# Patient Record
Sex: Female | Born: 1995 | Race: Black or African American | Hispanic: No | Marital: Single | Smoking: Never smoker
Health system: Southern US, Community
[De-identification: ages and names within clinical notes are randomized; demographics above are authoritative.]

## PROBLEM LIST (undated history)

## (undated) ENCOUNTER — Inpatient Hospital Stay (HOSPITAL_COMMUNITY): Payer: Self-pay

## (undated) DIAGNOSIS — R519 Headache, unspecified: Secondary | ICD-10-CM

## (undated) DIAGNOSIS — R51 Headache: Secondary | ICD-10-CM

## (undated) DIAGNOSIS — R011 Cardiac murmur, unspecified: Secondary | ICD-10-CM

## (undated) HISTORY — PX: NO PAST SURGERIES: SHX2092

---

## 2016-05-12 ENCOUNTER — Encounter (HOSPITAL_COMMUNITY): Payer: Self-pay

## 2016-05-12 ENCOUNTER — Emergency Department (HOSPITAL_COMMUNITY)
Admission: EM | Admit: 2016-05-12 | Discharge: 2016-05-13 | Disposition: A | Discharge status: Home / Self Care | Payer: Medicaid Other | Attending: Emergency Medicine | Admitting: Emergency Medicine

## 2016-05-12 DIAGNOSIS — N760 Acute vaginitis: Secondary | ICD-10-CM

## 2016-05-12 DIAGNOSIS — Z3A01 Less than 8 weeks gestation of pregnancy: Secondary | ICD-10-CM | POA: Diagnosis not present

## 2016-05-12 DIAGNOSIS — O23591 Infection of other part of genital tract in pregnancy, first trimester: Secondary | ICD-10-CM | POA: Diagnosis not present

## 2016-05-12 DIAGNOSIS — R1030 Lower abdominal pain, unspecified: Secondary | ICD-10-CM

## 2016-05-12 DIAGNOSIS — B9689 Other specified bacterial agents as the cause of diseases classified elsewhere: Secondary | ICD-10-CM

## 2016-05-12 DIAGNOSIS — Z3490 Encounter for supervision of normal pregnancy, unspecified, unspecified trimester: Secondary | ICD-10-CM

## 2016-05-12 LAB — COMPREHENSIVE METABOLIC PANEL
ALBUMIN: 4.7 g/dL (ref 3.5–5.0)
ALK PHOS: 56 U/L (ref 38–126)
ALT: 15 U/L (ref 14–54)
ANION GAP: 7 (ref 5–15)
AST: 17 U/L (ref 15–41)
BUN: 16 mg/dL (ref 6–20)
CALCIUM: 10.2 mg/dL (ref 8.9–10.3)
CO2: 23 mmol/L (ref 22–32)
Chloride: 106 mmol/L (ref 101–111)
Creatinine, Ser: 0.61 mg/dL (ref 0.44–1.00)
GFR calc Af Amer: >60 mL/min (ref >60)
GFR calc non Af Amer: >60 mL/min (ref >60)
GLUCOSE: 100 mg/dL — AB (ref 65–99)
POTASSIUM: 3.8 mmol/L (ref 3.5–5.1)
SODIUM: 136 mmol/L (ref 135–145)
Total Bilirubin: 0.7 mg/dL (ref 0.3–1.2)
Total Protein: 8.1 g/dL (ref 6.5–8.1)

## 2016-05-12 LAB — CBC
HEMATOCRIT: 38.1 % (ref 36.0–46.0)
HEMOGLOBIN: 13.7 g/dL (ref 12.0–15.0)
MCH: 28.6 pg (ref 26.0–34.0)
MCHC: 36 g/dL (ref 30.0–36.0)
MCV: 79.5 fL (ref 78.0–100.0)
Platelets: 417 10*3/uL — ABNORMAL HIGH (ref 150–400)
RBC: 4.79 MIL/uL (ref 3.87–5.11)
RDW: 12.9 % (ref 11.5–15.5)
WBC: 14.8 10*3/uL — ABNORMAL HIGH (ref 4.0–10.5)

## 2016-05-12 LAB — POC URINE PREG, ED: Preg Test, Ur: POSITIVE — AB

## 2016-05-12 LAB — LIPASE, BLOOD: Lipase: 30 U/L (ref 11–51)

## 2016-05-12 NOTE — ED Notes (Signed)
Bed: [facility] Expected date: 05/12/16 Expected time:  Means of arrival:  Comments: 20 y.o. female

## 2016-05-12 NOTE — ED Notes (Signed)
Patient c/o increasing abdominal pain today that began x1 week ago.  Patient states that has had nausea x1 week "on and off".  Denies chest pain, denies SOB.  Patient states that last BM was yesterday and was normal.  Patient states pain in lower mid abdomen.  Patient states that can not remember when last menstrual period was.  Denies bleeding.  Denies difficulty with urination or blood in urine.

## 2016-05-13 ENCOUNTER — Emergency Department (HOSPITAL_COMMUNITY): Payer: Medicaid Other

## 2016-05-13 LAB — URINALYSIS, ROUTINE W REFLEX MICROSCOPIC
BILIRUBIN URINE: NEGATIVE
GLUCOSE, UA: NEGATIVE mg/dL
HGB URINE DIPSTICK: NEGATIVE
KETONES UR: NEGATIVE mg/dL
LEUKOCYTES UA: NEGATIVE
Nitrite: NEGATIVE
PH: 6 (ref 5.0–8.0)
PROTEIN: NEGATIVE mg/dL
Specific Gravity, Urine: 1.023 (ref 1.005–1.030)

## 2016-05-13 LAB — WET PREP, GENITAL
Sperm: NONE SEEN
TRICH WET PREP: NONE SEEN
YEAST WET PREP: NONE SEEN

## 2016-05-13 LAB — HCG, QUANTITATIVE, PREGNANCY: HCG, BETA CHAIN, QUANT, S: 34804 m[IU]/mL — AB (ref <5)

## 2016-05-13 MED ORDER — PRENATAL COMPLETE 14-0.4 MG PO TABS: 1.0000 | ORAL_TABLET | Freq: Every day | ORAL | Status: DC

## 2016-05-13 MED ORDER — METRONIDAZOLE 500 MG PO TABS: 500.0000 mg | ORAL_TABLET | Freq: Two times a day (BID) | ORAL | Status: DC

## 2016-05-13 NOTE — Discharge Instructions (Signed)
Bacterial Vaginosis Bacterial vaginosis is a vaginal infection that occurs when the normal balance of bacteria in the vagina is disrupted. It results from an overgrowth of certain bacteria. This is the most common vaginal infection in women of childbearing age. Treatment is important to prevent complications, especially in pregnant women, as it can cause a premature delivery. CAUSES  Bacterial vaginosis is caused by an increase in harmful bacteria that are normally present in smaller amounts in the vagina. Several different kinds of bacteria can cause bacterial vaginosis. However, the reason that the condition develops is not fully understood. RISK FACTORS Certain activities or behaviors can put you at an increased risk of developing bacterial vaginosis, including:  Having a new sex partner or multiple sex partners.  Douching.  Using an intrauterine device (IUD) for contraception. Women do not get bacterial vaginosis from toilet seats, bedding, swimming pools, or contact with objects around them. SIGNS AND SYMPTOMS  Some women with bacterial vaginosis have no signs or symptoms. Common symptoms include:  Grey vaginal discharge.  A fishlike odor with discharge, especially after sexual intercourse.  Itching or burning of the vagina and vulva.  Burning or pain with urination. DIAGNOSIS  Your health care provider will take a medical history and examine the vagina for signs of bacterial vaginosis. A sample of vaginal fluid may be taken. Your health care provider will look at this sample under a microscope to check for bacteria and abnormal cells. A vaginal pH test may also be done.  TREATMENT  Bacterial vaginosis may be treated with antibiotic medicines. These may be given in the form of a pill or a vaginal cream. A second round of antibiotics may be prescribed if the condition comes back after treatment. Because bacterial vaginosis increases your risk for sexually transmitted diseases, getting  treated can help reduce your risk for chlamydia, gonorrhea, HIV, and herpes. HOME CARE INSTRUCTIONS   Only take over-the-counter or prescription medicines as directed by your health care provider.  If antibiotic medicine was prescribed, take it as directed. Make sure you finish it even if you start to feel better.  Tell all sexual partners that you have a vaginal infection. They should see their health care provider and be treated if they have problems, such as a mild rash or itching.  During treatment, it is important that you follow these instructions:  Avoid sexual activity or use condoms correctly.  Do not douche.  Avoid alcohol as directed by your health care provider.  Avoid breastfeeding as directed by your health care provider. SEEK MEDICAL CARE IF:   Your symptoms are not improving after 3 days of treatment.  You have increased discharge or pain.  You have a fever. MAKE SURE YOU:   Understand these instructions.  Will watch your condition.  Will get help right away if you are not doing well or get worse. FOR MORE INFORMATION  Centers for Disease Control and Prevention, Division of STD Prevention: SolutionApps.co.zawww.cdc.gov/std American Sexual Health Association (ASHA): www.ashastd.org    This information is not intended to replace advice given to you by your health care provider. Make sure you discuss any questions you have with your health care provider.   Document Released: 10/31/2005 Document Revised: 11/21/2014 Document Reviewed: 06/12/2013 Elsevier Interactive Patient Education 2016 [employer] Eating Plan for Pregnant Women While you are pregnant, your body will require additional nutrition to help support your growing baby. It is recommended that you consume:  150 additional calories each day during your first trimester.  300 additional calories each day during your second trimester.  300 additional calories each day during your third trimester. Eating a healthy,  well-balanced diet is very important for your health and for your baby's health. You also have a higher need for some vitamins and minerals, such as folic acid, calcium, iron, and vitamin D. WHAT DO I NEED TO KNOW ABOUT EATING DURING PREGNANCY?  Do not try to lose weight or go on a diet during pregnancy.  Choose healthy, nutritious foods. Choose  of a sandwich with a glass of milk instead of a candy bar or a high-calorie sugar-sweetened beverage.  Limit your overall intake of foods that have "empty calories." These are foods that have little nutritional value, such as sweets, desserts, candies, sugar-sweetened beverages, and fried foods.  Eat a variety of foods, especially fruits and vegetables.  Take a prenatal vitamin to help meet the additional needs during pregnancy, specifically for folic acid, iron, calcium, and vitamin D.  Remember to stay active. Ask your health care provider for exercise recommendations that are specific to you.  Practice good food safety and cleanliness, such as washing your hands before you eat and after you prepare raw meat. This helps to prevent foodborne illnesses, such as listeriosis, that can be very dangerous for your baby. Ask your health care provider for more information about listeriosis. WHAT DOES 150 EXTRA CALORIES LOOK LIKE? Healthy options for an additional 150 calories each day could be any of the following:  Plain low-fat yogurt (6-8 oz) with  cup of berries.  1 apple with 2 teaspoons of peanut butter.  Cut-up vegetables with  cup of hummus.  Low-fat chocolate milk (8 oz or 1 cup).  1 string cheese with 1 medium orange.   of a peanut butter and jelly sandwich on whole-wheat bread (1 tsp of peanut butter). For 300 calories, you could eat two of those healthy options each day.  WHAT IS A HEALTHY AMOUNT OF WEIGHT TO GAIN? The recommended amount of weight for you to gain is based on your pre-pregnancy BMI. If your pre-pregnancy BMI  was:  Less than 18 (underweight), you should gain 28-40 lb.  18-24.9 (normal), you should gain 25-35 lb.  25-29.9 (overweight), you should gain 15-25 lb.  Greater than 30 (obese), you should gain 11-20 lb. WHAT IF I AM HAVING TWINS OR MULTIPLES? Generally, pregnant women who will be having twins or multiples may need to increase their daily calories by 300-600 calories each day. The recommended range for total weight gain is 25-54 lb, depending on your pre-pregnancy BMI. Talk with your health care provider for specific guidance about additional nutritional needs, weight gain, and exercise during your pregnancy. WHAT FOODS CAN I EAT? Grains Any grains. Try to choose whole grains, such as whole-wheat bread, oatmeal, or brown rice. Vegetables Any vegetables. Try to eat a variety of colors and types of vegetables to get a full range of vitamins and minerals. Remember to wash your vegetables well before eating. Fruits Any fruits. Try to eat a variety of colors and types of fruit to get a full range of vitamins and minerals. Remember to wash your fruits well before eating. Meats and Other Protein Sources Lean meats, including chicken, [place]key, fish, and lean cuts of beef, veal, or pork. Make sure that all meats are cooked to "well done." Tofu. Tempeh. Beans. Eggs. Peanut butter and other nut butters. Seafood, such as shrimp, crab, and lobster. If you choose fish, select types that are higher in  omega-3 fatty acids, including salmon, herring, mussels, trout, sardines, and pollock. Make sure that all meats are cooked to food-safe temperatures. Dairy Pasteurized milk and milk alternatives. Pasteurized yogurt and pasteurized cheese. Cottage cheese. Sour cream. Beverages Water. Juices that contain 100% fruit juice or vegetable juice. Caffeine-free teas and decaffeinated coffee. Drinks that contain caffeine are okay to drink, but it is better to avoid caffeine. Keep your total caffeine intake to less than  200 mg each day (12 oz of coffee, tea, or soda) or as directed by your health care provider. Condiments Any pasteurized condiments. Sweets and Desserts Any sweets and desserts. Fats and Oils Any fats and oils. The items listed above may not be a complete list of recommended foods or beverages. Contact your dietitian for more options. WHAT FOODS ARE NOT RECOMMENDED? Vegetables Unpasteurized (raw) vegetable juices. Fruits Unpasteurized (raw) fruit juices. Meats and Other Protein Sources Cured meats that have nitrates, such as bacon, salami, and hotdogs. Luncheon meats, bologna, or other deli meats (unless they are reheated until they are steaming hot). Refrigerated pate, meat spreads from a meat counter, smoked seafood that is found in the refrigerated section of a store. Raw fish, such as sushi or sashimi. High mercury content fish, such as tilefish, shark, swordfish, and king mackerel. Raw meats, such as tuna or beef tartare. Undercooked meats and poultry. Make sure that all meats are cooked to food-safe temperatures. Dairy Unpasteurized (raw) milk and any foods that have raw milk in them. Soft cheeses, such as feta, queso blanco, queso fresco, Brie, Camembert cheeses, blue-veined cheeses, and Panela cheese (unless it is made with pasteurized milk, which must be stated on the label). Beverages Alcohol. Sugar-sweetened beverages, such as sodas, teas, or energy drinks. Condiments Homemade fermented foods and drinks, such as pickles, sauerkraut, or kombucha drinks. (Store-bought pasteurized versions of these are okay.) Other Salads that are made in the store, such as ham salad, chicken salad, egg salad, tuna salad, and seafood salad. The items listed above may not be a complete list of foods and beverages to avoid. Contact your dietitian for more information.   This information is not intended to replace advice given to you by your health care provider. Make sure you discuss any questions you  have with your health care provider.   Document Released: 08/15/2014 Document Reviewed: 08/15/2014 Elsevier Interactive Patient Education [employer]

## 2016-05-13 NOTE — ED Provider Notes (Signed)
CSN: [id number]     Arrival date & time 05/12/16  1921 History   First MD Initiated Contact with Patient 05/12/16 2153     Chief Complaint  Patient presents with  . Abdominal Pain     (Consider location/radiation/quality/duration/timing/severity/associated sxs/prior Treatment) HPI Comments: Patient presents with lower abdominal pain for one week, worse today. No dysuria, vaginal bleeding or discharge. No fever, nausea or vomiting. She cannot remember the date of her last menses and reports she may be pregnant. She denies back pain.  The history is provided by the patient. No language interpreter was used.    History reviewed. No pertinent past medical history. History reviewed. No pertinent past surgical history. No family history on file. Social History  Substance Use Topics  . Smoking status: Never Smoker   . Smokeless tobacco: None  . Alcohol Use: No   OB History    No data available     Review of Systems  Constitutional: Negative for fever and chills.  Respiratory: Negative.  Negative for shortness of breath.   Gastrointestinal: Positive for abdominal pain. Negative for nausea, vomiting and constipation.  Genitourinary: Negative.  Negative for dysuria, vaginal bleeding and vaginal discharge.  Musculoskeletal: Negative.  Negative for back pain.  Neurological: Negative.  Negative for syncope, weakness and light-headedness.      Allergies  Peanut-containing drug products  Home Medications   Prior to Admission medications   Not on File   BP 125/67 mmHg  Pulse 82  Temp(Src) 98.3 F (36.8 C) (Oral)  Resp 18  Ht 5\' 3"  (1.6 m)  Wt 65.772 kg  BMI 25.69 kg/m2  SpO2 100%  LMP  Physical Exam  Constitutional: She is oriented to person, place, and time. She appears well-developed and well-nourished.  HENT:  Head: Normocephalic.  Neck: Normal range of motion. Neck supple.  Cardiovascular: Normal rate.   Pulmonary/Chest: Effort normal.  Abdominal: Soft. Bowel  sounds are normal. There is no tenderness. There is no rebound and no guarding.  Genitourinary:  Cervix unremarkable in appearance. No CMT, adnexal mass or tenderness. No discharge, cervical bleeding or friability.  Musculoskeletal: Normal range of motion.  Neurological: She is alert and oriented to person, place, and time.  Skin: Skin is warm and dry. No rash noted.  Psychiatric: She has a normal mood and affect.    ED Course  Procedures (including critical care time) Labs Review Labs Reviewed  WET PREP, GENITAL - Abnormal; Notable for the following:    Clue Cells Wet Prep HPF POC PRESENT (*)    WBC, Wet Prep HPF POC FEW (*)    All other components within normal limits  COMPREHENSIVE METABOLIC PANEL - Abnormal; Notable for the following:    Glucose, Bld 100 (*)    All other components within normal limits  CBC - Abnormal; Notable for the following:    WBC 14.8 (*)    Platelets 417 (*)    All other components within normal limits  HCG, QUANTITATIVE, PREGNANCY - Abnormal; Notable for the following:    hCG, Beta Chain, Quant, S [id number] (*)    All other components within normal limits  POC URINE PREG, ED - Abnormal; Notable for the following:    Preg Test, Ur POSITIVE (*)    All other components within normal limits  LIPASE, BLOOD  URINALYSIS, ROUTINE W REFLEX MICROSCOPIC (NOT AT [facility])  RPR  HIV ANTIBODY (ROUTINE TESTING)  GC/CHLAMYDIA PROBE AMP ([place]) NOT AT [facility]    Imaging Review [place]  Ob Comp Less 14 Wks  05/13/2016  CLINICAL DATA:  Gradually worsening lower abdominal pain and nausea for 1 week EXAM: OBSTETRIC <14 WK US AND TRANSVAGINAL OB US TECHNIQUE: Both transabdominal and transvaginal ultrasound examinations were performed for complete evaluation of the gestation as well as the maternal uterus, adnexal regions, and pelvic cul-de-sac. Transvaginal technique was performed to assess early pregnancy. COMPARISON:  None. FINDINGS: Intrauterine gestational sac: Single Yolk sac:  Embryo:  Yes Cardiac Activity: Yes Heart Rate: 125  bpm MSD:   mm    w     d CRL:  5  mm   6 w   1 d                  US EDC: 01/05/2017 Subchorionic hemorrhage:  None visualized. Maternal uterus/adnexae: Normal ovaries. No abnormal fluid collections. IMPRESSION: Single living intrauterine gestation measuring 6 weeks 1 day by crown-rump length. Electronically Signed   By: Ellery Plunkaniel R Mitchell M.D.   On: 05/13/2016 02:40   [place] Ob Transvaginal  05/13/2016  CLINICAL DATA:  Gradually worsening lower abdominal pain and nausea for 1 week EXAM: OBSTETRIC <14 WK US AND TRANSVAGINAL OB US TECHNIQUE: Both transabdominal and transvaginal ultrasound examinations were performed for complete evaluation of the gestation as well as the maternal uterus, adnexal regions, and pelvic cul-de-sac. Transvaginal technique was performed to assess early pregnancy. COMPARISON:  None. FINDINGS: Intrauterine gestational sac: Single Yolk sac: Embryo:  Yes Cardiac Activity: Yes Heart Rate: 125  bpm MSD:   mm    w     d CRL:  5  mm   6 w   1 d                  US EDC: 01/05/2017 Subchorionic hemorrhage:  None visualized. Maternal uterus/adnexae: Normal ovaries. No abnormal fluid collections. IMPRESSION: Single living intrauterine gestation measuring 6 weeks 1 day by crown-rump length. Electronically Signed   By: Ellery Plunkaniel R Mitchell M.D.   On: 05/13/2016 02:40   I have personally reviewed and evaluated these images and lab results as part of my medical decision-making.   EKG Interpretation None      MDM   Final diagnoses:  [redacted] week gestation of pregnancy    1. Low abdominal pain 2. Pregnant, IUP, 6 weeks  Patient with lower abdominal pain found to be pregnant. Confirmed IUP on US of 6 w 1 d. Patient is a G1P0. Clue cells on exam but no finding concerning for further infection. Started on prenatal vitamins. Provided Flagyl for BV. Refer to OB.    Elpidio AnisShari Helyne Genther, PA-C 05/13/16 0316  Elpidio AnisShari Tivon Lemoine, PA-C 05/13/16 [id number]  Rolland PorterMark James,  MD 05/26/16 [phone]

## 2016-05-14 LAB — HIV ANTIBODY (ROUTINE TESTING W REFLEX): HIV Screen 4th Generation wRfx: NONREACTIVE

## 2016-05-14 LAB — RPR: RPR: NONREACTIVE

## 2016-05-16 LAB — GC/CHLAMYDIA PROBE AMP (~~LOC~~) NOT AT ARMC
Chlamydia: NEGATIVE
Neisseria Gonorrhea: NEGATIVE

## 2016-06-24 ENCOUNTER — Encounter (HOSPITAL_COMMUNITY): Payer: Self-pay | Admitting: Emergency Medicine

## 2016-06-24 ENCOUNTER — Emergency Department (HOSPITAL_COMMUNITY)
Admission: EM | Admit: 2016-06-24 | Discharge: 2016-06-24 | Disposition: A | Discharge status: Home / Self Care | Payer: Medicaid Other | Attending: Emergency Medicine | Admitting: Emergency Medicine

## 2016-06-24 DIAGNOSIS — Z3A12 12 weeks gestation of pregnancy: Secondary | ICD-10-CM | POA: Diagnosis not present

## 2016-06-24 DIAGNOSIS — O209 Hemorrhage in early pregnancy, unspecified: Secondary | ICD-10-CM

## 2016-06-24 DIAGNOSIS — O208 Other hemorrhage in early pregnancy: Secondary | ICD-10-CM | POA: Diagnosis not present

## 2016-06-24 DIAGNOSIS — Z3401 Encounter for supervision of normal first pregnancy, first trimester: Secondary | ICD-10-CM | POA: Diagnosis present

## 2016-06-24 LAB — CBC WITH DIFFERENTIAL/PLATELET
Basophils Absolute: 0 10*3/uL (ref 0.0–0.1)
Basophils Relative: 0 %
Eosinophils Absolute: 0.1 10*3/uL (ref 0.0–0.7)
Eosinophils Relative: 1 %
HCT: 31.9 % — ABNORMAL LOW (ref 36.0–46.0)
Hemoglobin: 11.5 g/dL — ABNORMAL LOW (ref 12.0–15.0)
Lymphocytes Relative: 19 %
Lymphs Abs: 2.1 10*3/uL (ref 0.7–4.0)
MCH: 28.9 pg (ref 26.0–34.0)
MCHC: 36.1 g/dL — ABNORMAL HIGH (ref 30.0–36.0)
MCV: 80.2 fL (ref 78.0–100.0)
Monocytes Absolute: 0.9 10*3/uL (ref 0.1–1.0)
Monocytes Relative: 8 %
Neutro Abs: 7.8 10*3/uL — ABNORMAL HIGH (ref 1.7–7.7)
Neutrophils Relative %: 72 %
Platelets: 362 10*3/uL (ref 150–400)
RBC: 3.98 MIL/uL (ref 3.87–5.11)
RDW: 13.1 % (ref 11.5–15.5)
WBC: 10.9 10*3/uL — ABNORMAL HIGH (ref 4.0–10.5)

## 2016-06-24 LAB — WET PREP, GENITAL
Clue Cells Wet Prep HPF POC: NONE SEEN
Sperm: NONE SEEN
Trich, Wet Prep: NONE SEEN
Yeast Wet Prep HPF POC: NONE SEEN

## 2016-06-24 LAB — ABO/RH: ABO/RH(D): O POS

## 2016-06-24 MED ORDER — PRENATAL COMPLETE 14-0.4 MG PO TABS: 1.0000 | ORAL_TABLET | Freq: Every day | ORAL | 0 refills | Status: AC

## 2016-06-24 NOTE — ED Triage Notes (Signed)
Patient states that she is 3 months pregnant and started having dark brown spotting last night when she wiped.  Patient states that she isnt saturating a panty liner.  Patient denies any pain.

## 2016-06-24 NOTE — Discharge Instructions (Signed)
[facility] for further evaluation and management. If he experienced any new or worsening signs or symptoms follow-up immediately in the emergency room [facility].

## 2016-06-24 NOTE — ED Provider Notes (Signed)
[facility]-EMERGENCY DEPT Provider Note   CSN: [id number] Arrival date & time: 06/24/16  1603  First Provider Contact:  None       History   Chief Complaint Chief Complaint  Patient presents with  . Vaginal Bleeding  . Routine Prenatal Visit    HPI Heather Vazquez is a 20 y.o. female.  HPI    G1PO female presents today with vaginal spotting. She Reports she is 12 weeks, was seen in the emergency room on 05/12/2016 diagnosed with intrauterine pregnancy and bacterial vaginosis. She was discharged home with instructions to follow-up with OB. Patient reports since then she has not followed with OB as she did not have insurance. She notes she has not taking prenatal vitamins. She denies any complaints including abdominal pain, vaginal bleeding or discharge, headaches or dizziness, cramping, rash, no Shari swelling, or any other concerning signs or symptoms. Patient reports she is still sexually active, denies any sexual intercourse prior to the vaginal spotting. She reports it's a very small amount, not enough to use a tampon.   History reviewed. No pertinent past medical history.  There are no active problems to display for this patient.   History reviewed. No pertinent surgical history.  OB History    Gravida Para Term Preterm AB Living   1             SAB TAB Ectopic Multiple Live Births                   Home Medications    Prior to Admission medications   Medication Sig Start Date End Date Taking? Authorizing Provider  metroNIDAZOLE (FLAGYL) 500 MG tablet Take 1 tablet (500 mg total) by mouth 2 (two) times daily. Patient not taking: Reported on 06/24/2016 05/13/16   Elpidio Anis, PA-C  Prenatal Vit-Fe Fumarate-FA (PRENATAL COMPLETE) 14-0.4 MG TABS Take 1 tablet by mouth daily. 06/24/16   Eyvonne Mechanic, PA-C    Family History No family history on file.  Social History Social History  Substance Use Topics  . Smoking status: Never Smoker  . Smokeless tobacco: Never Used    . Alcohol use No     Allergies   Peanut-containing drug products   Review of Systems Review of Systems   Physical Exam Updated Vital Signs BP 106/65 (BP Location: Left Arm)   Pulse 80   Temp 98.4 F (36.9 C) (Oral)   Resp 16   Ht  (1.575 m)   Wt 67.7 kg   SpO2 100%   BMI 27.30 kg/m   Physical Exam  Constitutional: She is oriented to person, place, and time. She appears well-developed and well-nourished.  HENT:  Head: Normocephalic and atraumatic.  Eyes: Conjunctivae are normal. Pupils are equal, round, and reactive to light. Right eye exhibits no discharge. Left eye exhibits no discharge. No scleral icterus.  Neck: Normal range of motion. No JVD present. No tracheal deviation present.  Pulmonary/Chest: Effort normal. No stridor.  Abdominal:  Gravid abdomin appropriate for gestational age, nontender to palpation  Genitourinary:  Genitourinary Comments: Cervical os is closed, no vaginal bleeding or significant discharge no cervical motion tenderness  Neurological: She is alert and oriented to person, place, and time. Coordination normal.  Psychiatric: She has a normal mood and affect. Her behavior is normal. Judgment and thought content normal.  Nursing note and vitals reviewed.  Fetal heart rate 170   ED Treatments / Results  Labs (all labs ordered are listed, but only abnormal results are displayed)  Labs Reviewed  WET PREP, GENITAL - Abnormal; Notable for the following:       Result Value   WBC, Wet Prep HPF POC FEW (*)    All other components within normal limits  CBC WITH DIFFERENTIAL/PLATELET - Abnormal; Notable for the following:    WBC 10.9 (*)    Hemoglobin 11.5 (*)    HCT 31.9 (*)    MCHC 36.1 (*)    Neutro Abs 7.8 (*)    All other components within normal limits  ABO/RH  GC/CHLAMYDIA PROBE AMP ([place]) NOT AT [facility]    EKG  EKG Interpretation None       Radiology No results found.  Procedures Procedures (including critical  care time)  Medications Ordered in ED Medications - No data to display   Initial Impression / Assessment and Plan / ED Course  I have reviewed the triage vital signs and the nursing notes.  Pertinent labs & imaging results that were available during my care of the patient were reviewed by me and considered in my medical decision making (see chart for details).  Clinical Course     Final Clinical Impressions(s) / ED Diagnoses   Final diagnoses:  Vaginal bleeding in pregnancy, first trimester    Labs: CBC, ABO/ Rh, wet prep  Imaging:   Consults:  Therapeutics:  Discharge Meds:   Assessment/Plan:  20 year old female 12 weeks presents with vaginal spotting. No associated abdominal pain cramping, nausea or vomiting, no discharge. No abdominal pain. Exam shows closed cervix, no vaginal bleeding, no cervical motion tenderness, no findings on wet prep, low suspicion for STD related infection. Patient is Rh+, no significant bleeding noted necessitate further evaluation or management here in the ED. Patient has stable vital signs, she will be discharged home with close follow-up with OB/GYN. She is instructed to proceed to the women's emergency room if she experiences any concerning signs or symptoms. Patient verbalized understanding and agreement to today's plan and had no further questions or concerns at time of discharge    New Prescriptions Current Discharge Medication List       Eyvonne MechanicJeffrey Zeus Marquis, PA-C 06/24/16 1934    Rolland PorterMark James, MD 06/28/16 1134

## 2016-06-27 LAB — GC/CHLAMYDIA PROBE AMP (~~LOC~~) NOT AT ARMC
Chlamydia: NEGATIVE
Neisseria Gonorrhea: NEGATIVE

## 2016-07-07 ENCOUNTER — Encounter (HOSPITAL_COMMUNITY): Payer: Self-pay | Admitting: *Deleted

## 2016-07-07 ENCOUNTER — Inpatient Hospital Stay (HOSPITAL_COMMUNITY)
Admission: AD | Admit: 2016-07-07 | Discharge: 2016-07-07 | Disposition: A | Discharge status: Home / Self Care | Payer: Medicaid Other | Source: Ambulatory Visit | Attending: Obstetrics and Gynecology | Admitting: Obstetrics and Gynecology

## 2016-07-07 DIAGNOSIS — Z3A14 14 weeks gestation of pregnancy: Secondary | ICD-10-CM | POA: Insufficient documentation

## 2016-07-07 DIAGNOSIS — R109 Unspecified abdominal pain: Secondary | ICD-10-CM | POA: Diagnosis not present

## 2016-07-07 DIAGNOSIS — R1030 Lower abdominal pain, unspecified: Secondary | ICD-10-CM | POA: Diagnosis present

## 2016-07-07 DIAGNOSIS — N949 Unspecified condition associated with female genital organs and menstrual cycle: Secondary | ICD-10-CM | POA: Diagnosis not present

## 2016-07-07 DIAGNOSIS — O26892 Other specified pregnancy related conditions, second trimester: Secondary | ICD-10-CM | POA: Diagnosis not present

## 2016-07-07 HISTORY — DX: Headache, unspecified: R51.9

## 2016-07-07 HISTORY — DX: Cardiac murmur, unspecified: R01.1

## 2016-07-07 HISTORY — DX: Headache: R51

## 2016-07-07 LAB — WET PREP, GENITAL
CLUE CELLS WET PREP: NONE SEEN
Sperm: NONE SEEN
TRICH WET PREP: NONE SEEN
WBC, Wet Prep HPF POC: NONE SEEN
Yeast Wet Prep HPF POC: NONE SEEN

## 2016-07-07 LAB — URINALYSIS, ROUTINE W REFLEX MICROSCOPIC
Bilirubin Urine: NEGATIVE
GLUCOSE, UA: NEGATIVE mg/dL
HGB URINE DIPSTICK: NEGATIVE
Ketones, ur: NEGATIVE mg/dL
Leukocytes, UA: NEGATIVE
Nitrite: NEGATIVE
PROTEIN: NEGATIVE mg/dL
Specific Gravity, Urine: 1.02 (ref 1.005–1.030)
pH: 7.5 (ref 5.0–8.0)

## 2016-07-07 NOTE — Discharge Instructions (Signed)

## 2016-07-07 NOTE — MAU Note (Signed)
C/o lower abdominal pain for past 2 days; denies any vaginal bleeding or vaginal discharge; denies any injury;

## 2016-07-07 NOTE — MAU Note (Signed)
Pt presents to MAU with complaints of lower abdominal cramping for a couple of days. Denies any vaginal bleeding or abnormal discharge

## 2016-07-07 NOTE — MAU Provider Note (Signed)
History     CSN: [id number]  Arrival date and time: 07/07/16 1658   First Provider Initiated Contact with Patient 07/07/16 1809      No chief complaint on file.  HPI Ms. Heather Vazquez is a 20 y.o. G1P0 at 3645w0d who presents to MAU today with complaint of lower abdominal pain x 2 days. She states pain is bilateral and rated at 5/10 now. She has not taken anything for pain. She denies vaginal bleeding, LOF, vaginal discharge, UTI symptoms, N/V/D or constipation, fever or sick contacts. She states pain is worse with ambulation and better with rest.   OB History    Gravida Para Term Preterm AB Living   1             SAB TAB Ectopic Multiple Live Births                  Past Medical History:  Diagnosis Date  . Headache   . Heart murmur     Past Surgical History:  Procedure Laterality Date  . NO PAST SURGERIES      Family History  Problem Relation Age of Onset  . Cancer Maternal Grandmother   . Cancer Maternal Grandfather   . Cancer Paternal Grandmother   . Cancer Paternal Grandfather     Social History  Substance Use Topics  . Smoking status: Never Smoker  . Smokeless tobacco: Never Used  . Alcohol use No    Allergies:  Allergies  Allergen Reactions  . Peanut-Containing Drug Products Shortness Of Breath, Swelling and Other (See Comments)    Reaction:  Tongue swelling    Prescriptions Prior to Admission  Medication Sig Dispense Refill Last Dose  . metroNIDAZOLE (FLAGYL) 500 MG tablet Take 1 tablet (500 mg total) by mouth 2 (two) times daily. (Patient not taking: Reported on 06/24/2016) 14 tablet 0   . Prenatal Vit-Fe Fumarate-FA (PRENATAL COMPLETE) 14-0.4 MG TABS Take 1 tablet by mouth daily. (Patient not taking: Reported on 07/07/2016) 60 each 0 Not Taking at Unknown time    Review of Systems  Constitutional: Negative for fever and malaise/fatigue.  Gastrointestinal: Positive for abdominal pain. Negative for constipation, diarrhea, nausea and vomiting.   Genitourinary: Negative for dysuria, frequency and urgency.       Neg- vaginal bleeding, discharge, LOF   Physical Exam   Blood pressure 131/72, pulse 98, temperature 98.2 F (36.8 C), resp. rate 18, weight 150 lb (68 kg), last menstrual period 02/27/2016.  Physical Exam  Nursing note and vitals reviewed. Constitutional: She is oriented to person, place, and time. She appears well-developed and well-nourished. No distress.  HENT:  Head: Normocephalic and atraumatic.  Cardiovascular: Normal rate.   Respiratory: Effort normal.  GI: Soft. She exhibits no distension and no mass. There is tenderness (mild suprapubic tenderness to palpation ). There is no rebound and no guarding.  Genitourinary: Uterus is enlarged. Uterus is not tender. Cervix exhibits no motion tenderness. No bleeding in the vagina. Vaginal discharge found.  Neurological: She is alert and oriented to person, place, and time.  Skin: Skin is warm and dry. No erythema.  Psychiatric: She has a normal mood and affect.  Dilation: Closed Effacement (%): Thick Cervical Position: Posterior Exam by:: Magnus SinningWenzel, PA   Results for orders placed or performed during the hospital encounter of 07/07/16 (from the past 24 hour(s))  Urinalysis, Routine w reflex microscopic ([facility])     Status: Abnormal   Collection Time: 07/07/16  5:40 PM  Result  Value Ref Range   Color, Urine YELLOW YELLOW   APPearance CLOUDY (A) CLEAR   Specific Gravity, Urine 1.020 1.005 - 1.030   pH 7.5 5.0 - 8.0   Glucose, UA NEGATIVE NEGATIVE mg/dL   Hgb urine dipstick NEGATIVE NEGATIVE   Bilirubin Urine NEGATIVE NEGATIVE   Ketones, ur NEGATIVE NEGATIVE mg/dL   Protein, ur NEGATIVE NEGATIVE mg/dL   Nitrite NEGATIVE NEGATIVE   Leukocytes, UA NEGATIVE NEGATIVE  Wet prep, genital     Status: None   Collection Time: 07/07/16  6:20 PM  Result Value Ref Range   Yeast Wet Prep HPF POC NONE SEEN NONE SEEN   Trich, Wet Prep NONE SEEN NONE SEEN   Clue Cells Wet  Prep HPF POC NONE SEEN NONE SEEN   WBC, Wet Prep HPF POC NONE SEEN NONE SEEN   Sperm NONE SEEN     MAU Course  Procedures None  MDM FHR - 150 bpm with doppler UA and wet prep today   Assessment and Plan  A: SIUP at 3349w0d Round ligament pain  P: Discharge home Discussed Tylenol PRN for pain Discussed use of abdominal binder, warm bath/shower and moderation of activity for pain relief Second trimester precautions discussed Patient advised to follow-up with OB provider of choice, list of area OB providers given Patient may return to MAU as needed or if her condition were to change or worsen   Marny LowensteinJulie N Warden Buffa, PA-C  07/07/2016, 6:55 PM

## 2016-07-08 LAB — GC/CHLAMYDIA PROBE AMP (~~LOC~~) NOT AT ARMC
Chlamydia: NEGATIVE
Neisseria Gonorrhea: NEGATIVE

## 2016-07-31 DIAGNOSIS — Z3490 Encounter for supervision of normal pregnancy, unspecified, unspecified trimester: Secondary | ICD-10-CM | POA: Insufficient documentation

## 2016-07-31 DIAGNOSIS — O209 Hemorrhage in early pregnancy, unspecified: Secondary | ICD-10-CM | POA: Insufficient documentation

## 2016-08-01 ENCOUNTER — Ambulatory Visit (INDEPENDENT_AMBULATORY_CARE_PROVIDER_SITE_OTHER): Payer: Medicaid Other | Admitting: Family Medicine

## 2016-08-01 ENCOUNTER — Encounter: Payer: Self-pay | Admitting: Family Medicine

## 2016-08-01 DIAGNOSIS — O209 Hemorrhage in early pregnancy, unspecified: Secondary | ICD-10-CM | POA: Diagnosis not present

## 2016-08-01 DIAGNOSIS — Z3480 Encounter for supervision of other normal pregnancy, unspecified trimester: Secondary | ICD-10-CM | POA: Diagnosis not present

## 2016-08-01 NOTE — Progress Notes (Signed)
   Subjective:    Heather Vazquez is a G1P0 60106w4d being seen today for her first obstetrical visit.  Her obstetrical history is not significant. Pregnancy history fully reviewed.  Patient reports nausea and bleeding x 1, now resolved.  Vitals:   08/01/16 0823  BP: 114/72  Pulse: 100  Temp: 99.1 F (37.3 C)  Weight: 143 lb 12.8 oz (65.2 kg)    HISTORY: OB History  Gravida Para Term Preterm AB Living  1            SAB TAB Ectopic Multiple Live Births               # Outcome Date GA Lbr Len/2nd Weight Sex Delivery Anes PTL Lv  1 Current              Past Medical History:  Diagnosis Date  . Headache   . Heart murmur    Past Surgical History:  Procedure Laterality Date  . NO PAST SURGERIES     Family History  Problem Relation Age of Onset  . Cancer Maternal Grandmother   . Cancer Maternal Grandfather   . Cancer Paternal Grandmother   . Cancer Paternal Grandfather      Exam    System: Skin: normal coloration and turgor, no rashes    Neurologic: normal, gait normal; reflexes normal and symmetric   Extremities: normal strength, tone, and muscle mass   HEENT extra ocular movement intact, sclera clear, anicteric, oropharynx clear, no lesions, neck supple with midline trachea and thyroid without masses   Mouth/Teeth mucous membranes moist, pharynx normal without lesions and dental hygiene good   Neck supple   Cardiovascular: regular rate and rhythm, no murmurs or gallops   Respiratory:  appears well, vitals normal, no respiratory distress, acyanotic, normal RR, ear and throat exam is normal, neck free of mass or lymphadenopathy, chest clear, no wheezing, crepitations, rhonchi, normal symmetric air entry   Abdomen: soft, non-tender; bowel sounds normal; no masses,  no organomegaly      Assessment/Plan:  1. Supervision of normal pregnancy, antepartum, unspecified trimester History reviewed, problem list updated, labs reviewed - US OB Comp + 14 Wk; Future - Culture, OB  Urine - Cystic Fibrosis Mutation 97 - AFP, Quad Screen - Hemoglobinopathy evaluation - HIV antibody - Prenatal Profile I - GC/Chlamydia Probe Amp - Toxassure  2. Vaginal bleeding in pregnant patient at less than [redacted] weeks gestation Not post-coital Await results of anatomy scan   Reva Boresanya S Khristine Verno 08/01/2016

## 2016-08-01 NOTE — Patient Instructions (Signed)
Second Trimester of Pregnancy The second trimester is from week 13 through week 28, months 4 through 6. The second trimester is often a time when you feel your best. Your body has also adjusted to being pregnant, and you begin to feel better physically. Usually, morning sickness has lessened or quit completely, you may have more energy, and you may have an increase in appetite. The second trimester is also a time when the fetus is growing rapidly. At the end of the sixth month, the fetus is about 9 inches long and weighs about 1 pounds. You will likely begin to feel the baby move (quickening) between 18 and 20 weeks of the pregnancy. BODY CHANGES Your body goes through many changes during pregnancy. The changes vary from woman to woman.   Your weight will continue to increase. You will notice your lower abdomen bulging out.  You may begin to get stretch marks on your hips, abdomen, and breasts.  You may develop headaches that can be relieved by medicines approved by your health care provider.  You may urinate more often because the fetus is pressing on your bladder.  You may develop or continue to have heartburn as a result of your pregnancy.  You may develop constipation because certain hormones are causing the muscles that push waste through your intestines to slow down.  You may develop hemorrhoids or swollen, bulging veins (varicose veins).  You may have back pain because of the weight gain and pregnancy hormones relaxing your joints between the bones in your pelvis and as a result of a shift in weight and the muscles that support your balance.  Your breasts will continue to grow and be tender.  Your gums may bleed and may be sensitive to brushing and flossing.  Dark spots or blotches (chloasma, mask of pregnancy) may develop on your face. This will likely fade after the baby is born.  A dark line from your belly button to the pubic area (linea nigra) may appear. This will likely  fade after the baby is born.  You may have changes in your hair. These can include thickening of your hair, rapid growth, and changes in texture. Some women also have hair loss during or after pregnancy, or hair that feels dry or thin. Your hair will most likely return to normal after your baby is born. WHAT TO EXPECT AT YOUR PRENATAL VISITS During a routine prenatal visit:  You will be weighed to make sure you and the fetus are growing normally.  Your blood pressure will be taken.  Your abdomen will be measured to track your baby's growth.  The fetal heartbeat will be listened to.  Any test results from the previous visit will be discussed. Your health care provider may ask you:  How you are feeling.  If you are feeling the baby move.  If you have had any abnormal symptoms, such as leaking fluid, bleeding, severe headaches, or abdominal cramping.  If you are using any tobacco products, including cigarettes, chewing tobacco, and electronic cigarettes.  If you have any questions. Other tests that may be performed during your second trimester include:  Blood tests that check for:  Low iron levels (anemia).  Gestational diabetes (between 24 and 28 weeks).  Rh antibodies.  Urine tests to check for infections, diabetes, or protein in the urine.  An ultrasound to confirm the proper growth and development of the baby.  An amniocentesis to check for possible genetic problems.  Fetal screens for spina bifida   and Down syndrome.  HIV (human immunodeficiency virus) testing. Routine prenatal testing includes screening for HIV, unless you choose not to have this test. HOME CARE INSTRUCTIONS   Avoid all smoking, herbs, alcohol, and unprescribed drugs. These chemicals affect the formation and growth of the baby.  Do not use any tobacco products, including cigarettes, chewing tobacco, and electronic cigarettes. If you need help quitting, ask your health care provider. You may receive  counseling support and other resources to help you quit.  Follow your health care provider's instructions regarding medicine use. There are medicines that are either safe or unsafe to take during pregnancy.  Exercise only as directed by your health care provider. Experiencing uterine cramps is a good sign to stop exercising.  Continue to eat regular, healthy meals.  Wear a good support bra for breast tenderness.  Do not use hot tubs, steam rooms, or saunas.  Wear your seat belt at all times when driving.  Avoid raw meat, uncooked cheese, cat litter boxes, and soil used by cats. These carry germs that can cause birth defects in the baby.  Take your prenatal vitamins.  Take 1500-2000 mg of calcium daily starting at the 20th week of pregnancy until you deliver your baby.  Try taking a stool softener (if your health care provider approves) if you develop constipation. Eat more high-fiber foods, such as fresh vegetables or fruit and whole grains. Drink plenty of fluids to keep your urine clear or pale yellow.  Take warm sitz baths to soothe any pain or discomfort caused by hemorrhoids. Use hemorrhoid cream if your health care provider approves.  If you develop varicose veins, wear support hose. Elevate your feet for 15 minutes, 3-4 times a day. Limit salt in your diet.  Avoid heavy lifting, wear low heel shoes, and practice good posture.  Rest with your legs elevated if you have leg cramps or low back pain.  Visit your dentist if you have not gone yet during your pregnancy. Use a soft toothbrush to brush your teeth and be gentle when you floss.  A sexual relationship may be continued unless your health care provider directs you otherwise.  Continue to go to all your prenatal visits as directed by your health care provider. SEEK MEDICAL CARE IF:   You have dizziness.  You have mild pelvic cramps, pelvic pressure, or nagging pain in the abdominal area.  You have persistent nausea,  vomiting, or diarrhea.  You have a bad smelling vaginal discharge.  You have pain with urination. SEEK IMMEDIATE MEDICAL CARE IF:   You have a fever.  You are leaking fluid from your vagina.  You have spotting or bleeding from your vagina.  You have severe abdominal cramping or pain.  You have rapid weight gain or loss.  You have shortness of breath with chest pain.  You notice sudden or extreme swelling of your face, hands, ankles, feet, or legs.  You have not felt your baby move in over an hour.  You have severe headaches that do not go away with medicine.  You have vision changes.   This information is not intended to replace advice given to you by your health care provider. Make sure you discuss any questions you have with your health care provider.   Document Released: 10/25/2001 Document Revised: 11/21/2014 Document Reviewed: 01/01/2013 Elsevier Interactive Patient Education 2016 [employer]   Breastfeeding Deciding to breastfeed is one of the best choices you can make for you and your baby. A change   in hormones during pregnancy causes your breast tissue to grow and increases the number and size of your milk ducts. These hormones also allow proteins, sugars, and fats from your blood supply to make breast milk in your milk-producing glands. Hormones prevent breast milk from being released before your baby is born as well as prompt milk flow after birth. Once breastfeeding has begun, thoughts of your baby, as well as his or her sucking or crying, can stimulate the release of milk from your milk-producing glands.  BENEFITS OF BREASTFEEDING For Your Baby  Your first milk (colostrum) helps your baby's digestive system function better.  There are antibodies in your milk that help your baby fight off infections.  Your baby has a lower incidence of asthma, allergies, and sudden infant death syndrome.  The nutrients in breast milk are better for your baby than infant  formulas and are designed uniquely for your baby's needs.  Breast milk improves your baby's brain development.  Your baby is less likely to develop other conditions, such as childhood obesity, asthma, or type 2 diabetes mellitus. For You  Breastfeeding helps to create a very special bond between you and your baby.  Breastfeeding is convenient. Breast milk is always available at the correct temperature and costs nothing.  Breastfeeding helps to burn calories and helps you lose the weight gained during pregnancy.  Breastfeeding makes your uterus contract to its prepregnancy size faster and slows bleeding (lochia) after you give birth.   Breastfeeding helps to lower your risk of developing type 2 diabetes mellitus, osteoporosis, and breast or ovarian cancer later in life. SIGNS THAT YOUR BABY IS HUNGRY Early Signs of Hunger  Increased alertness or activity.  Stretching.  Movement of the head from side to side.  Movement of the head and opening of the mouth when the corner of the mouth or cheek is stroked (rooting).  Increased sucking sounds, smacking lips, cooing, sighing, or squeaking.  Hand-to-mouth movements.  Increased sucking of fingers or hands. Late Signs of Hunger  Fussing.  Intermittent crying. Extreme Signs of Hunger Signs of extreme hunger will require calming and consoling before your baby will be able to breastfeed successfully. Do not wait for the following signs of extreme hunger to occur before you initiate breastfeeding:  Restlessness.  A loud, strong cry.  Screaming. BREASTFEEDING BASICS Breastfeeding Initiation  Find a comfortable place to sit or lie down, with your neck and back well supported.  Place a pillow or rolled up blanket under your baby to bring him or her to the level of your breast (if you are seated). Nursing pillows are specially designed to help support your arms and your baby while you breastfeed.  Make sure that your baby's  abdomen is facing your abdomen.  Gently massage your breast. With your fingertips, massage from your chest wall toward your nipple in a circular motion. This encourages milk flow. You may need to continue this action during the feeding if your milk flows slowly.  Support your breast with 4 fingers underneath and your thumb above your nipple. Make sure your fingers are well away from your nipple and your baby's mouth.  Stroke your baby's lips gently with your finger or nipple.  When your baby's mouth is open wide enough, quickly bring your baby to your breast, placing your entire nipple and as much of the colored area around your nipple (areola) as possible into your baby's mouth.  More areola should be visible above your baby's upper lip than   below the lower lip.  Your baby's tongue should be between his or her lower gum and your breast.  Ensure that your baby's mouth is correctly positioned around your nipple (latched). Your baby's lips should create a seal on your breast and be turned out (everted).  It is common for your baby to suck about 2-3 minutes in order to start the flow of breast milk. Latching Teaching your baby how to latch on to your breast properly is very important. An improper latch can cause nipple pain and decreased milk supply for you and poor weight gain in your baby. Also, if your baby is not latched onto your nipple properly, he or she may swallow some air during feeding. This can make your baby fussy. Burping your baby when you switch breasts during the feeding can help to get rid of the air. However, teaching your baby to latch on properly is still the best way to prevent fussiness from swallowing air while breastfeeding. Signs that your baby has successfully latched on to your nipple:  Silent tugging or silent sucking, without causing you pain.  Swallowing heard between every 3-4 sucks.  Muscle movement above and in front of his or her ears while sucking. Signs  that your baby has not successfully latched on to nipple:  Sucking sounds or smacking sounds from your baby while breastfeeding.  Nipple pain. If you think your baby has not latched on correctly, slip your finger into the corner of your baby's mouth to break the suction and place it between your baby's gums. Attempt breastfeeding initiation again. Signs of Successful Breastfeeding Signs from your baby:  A gradual decrease in the number of sucks or complete cessation of sucking.  Falling asleep.  Relaxation of his or her body.  Retention of a small amount of milk in his or her mouth.  Letting go of your breast by himself or herself. Signs from you:  Breasts that have increased in firmness, weight, and size 1-3 hours after feeding.  Breasts that are softer immediately after breastfeeding.  Increased milk volume, as well as a change in milk consistency and color by the fifth day of breastfeeding.  Nipples that are not sore, cracked, or bleeding. Signs That Your Baby is Getting Enough Milk  Wetting at least 3 diapers in a 24-hour period. The urine should be clear and pale yellow by age 5 days.  At least 3 stools in a 24-hour period by age 5 days. The stool should be soft and yellow.  At least 3 stools in a 24-hour period by age 7 days. The stool should be seedy and yellow.  No loss of weight greater than 10% of birth weight during the first 3 days of age.  Average weight gain of 4-7 ounces (113-198 g) per week after age 4 days.  Consistent daily weight gain by age 5 days, without weight loss after the age of 2 weeks. After a feeding, your baby may spit up a small amount. This is common. BREASTFEEDING FREQUENCY AND DURATION Frequent feeding will help you make more milk and can prevent sore nipples and breast engorgement. Breastfeed when you feel the need to reduce the fullness of your breasts or when your baby shows signs of hunger. This is called "breastfeeding on demand." Avoid  introducing a pacifier to your baby while you are working to establish breastfeeding (the first 4-6 weeks after your baby is born). After this time you may choose to use a pacifier. Research has shown that   pacifier use during the first year of a baby's life decreases the risk of sudden infant death syndrome (SIDS). Allow your baby to feed on each breast as long as he or she wants. Breastfeed until your baby is finished feeding. When your baby unlatches or falls asleep while feeding from the first breast, offer the second breast. Because newborns are often sleepy in the first few weeks of life, you may need to awaken your baby to get him or her to feed. Breastfeeding times will vary from baby to baby. However, the following rules can serve as a guide to help you ensure that your baby is properly fed:  Newborns (babies 4 weeks of age or younger) may breastfeed every 1-3 hours.  Newborns should not go longer than 3 hours during the day or 5 hours during the night without breastfeeding.  You should breastfeed your baby a minimum of 8 times in a 24-hour period until you begin to introduce solid foods to your baby at around 6 months of age. BREAST MILK PUMPING Pumping and storing breast milk allows you to ensure that your baby is exclusively fed your breast milk, even at times when you are unable to breastfeed. This is especially important if you are going back to work while you are still breastfeeding or when you are not able to be present during feedings. Your lactation consultant can give you guidelines on how long it is safe to store breast milk. A breast pump is a machine that allows you to pump milk from your breast into a sterile bottle. The pumped breast milk can then be stored in a refrigerator or freezer. Some breast pumps are operated by hand, while others use electricity. Ask your lactation consultant which type will work best for you. Breast pumps can be purchased, but some hospitals and  breastfeeding support groups lease breast pumps on a monthly basis. A lactation consultant can teach you how to hand express breast milk, if you prefer not to use a pump. CARING FOR YOUR BREASTS WHILE YOU BREASTFEED Nipples can become dry, cracked, and sore while breastfeeding. The following recommendations can help keep your breasts moisturized and healthy:  Avoid using soap on your nipples.  Wear a supportive bra. Although not required, special nursing bras and tank tops are designed to allow access to your breasts for breastfeeding without taking off your entire bra or top. Avoid wearing underwire-style bras or extremely tight bras.  Air dry your nipples for 3-4minutes after each feeding.  Use only cotton bra pads to absorb leaked breast milk. Leaking of breast milk between feedings is normal.  Use lanolin on your nipples after breastfeeding. Lanolin helps to maintain your skin's normal moisture barrier. If you use pure lanolin, you do not need to wash it off before feeding your baby again. Pure lanolin is not toxic to your baby. You may also hand express a few drops of breast milk and gently massage that milk into your nipples and allow the milk to air dry. In the first few weeks after giving birth, some women experience extremely full breasts (engorgement). Engorgement can make your breasts feel heavy, warm, and tender to the touch. Engorgement peaks within 3-5 days after you give birth. The following recommendations can help ease engorgement:  Completely empty your breasts while breastfeeding or pumping. You may want to start by applying warm, moist heat (in the shower or with warm water-soaked hand towels) just before feeding or pumping. This increases circulation and helps the milk   flow. If your baby does not completely empty your breasts while breastfeeding, pump any extra milk after he or she is finished.  Wear a snug bra (nursing or regular) or tank top for 1-2 days to signal your body  to slightly decrease milk production.  Apply ice packs to your breasts, unless this is too uncomfortable for you.  Make sure that your baby is latched on and positioned properly while breastfeeding. If engorgement persists after 48 hours of following these recommendations, contact your health care provider or a lactation consultant. OVERALL HEALTH CARE RECOMMENDATIONS WHILE BREASTFEEDING  Eat healthy foods. Alternate between meals and snacks, eating 3 of each per day. Because what you eat affects your breast milk, some of the foods may make your baby more irritable than usual. Avoid eating these foods if you are sure that they are negatively affecting your baby.  Drink milk, fruit juice, and water to satisfy your thirst (about 10 glasses a day).  Rest often, relax, and continue to take your prenatal vitamins to prevent fatigue, stress, and anemia.  Continue breast self-awareness checks.  Avoid chewing and smoking tobacco. Chemicals from cigarettes that pass into breast milk and exposure to secondhand smoke may harm your baby.  Avoid alcohol and drug use, including marijuana. Some medicines that may be harmful to your baby can pass through breast milk. It is important to ask your health care provider before taking any medicine, including all over-the-counter and prescription medicine as well as vitamin and herbal supplements. It is possible to become pregnant while breastfeeding. If birth control is desired, ask your health care provider about options that will be safe for your baby. SEEK MEDICAL CARE IF:  You feel like you want to stop breastfeeding or have become frustrated with breastfeeding.  You have painful breasts or nipples.  Your nipples are cracked or bleeding.  Your breasts are red, tender, or warm.  You have a swollen area on either breast.  You have a fever or chills.  You have nausea or vomiting.  You have drainage other than breast milk from your nipples.  Your  breasts do not become full before feedings by the fifth day after you give birth.  You feel sad and depressed.  Your baby is too sleepy to eat well.  Your baby is having trouble sleeping.   Your baby is wetting less than 3 diapers in a 24-hour period.  Your baby has less than 3 stools in a 24-hour period.  Your baby's skin or the white part of his or her eyes becomes yellow.   Your baby is not gaining weight by 5 days of age. SEEK IMMEDIATE MEDICAL CARE IF:  Your baby is overly tired (lethargic) and does not want to wake up and feed.  Your baby develops an unexplained fever.   This information is not intended to replace advice given to you by your health care provider. Make sure you discuss any questions you have with your health care provider.   Document Released: 10/31/2005 Document Revised: 07/22/2015 Document Reviewed: 04/24/2013 Elsevier Interactive Patient Education 2016 [employer]  

## 2016-08-01 NOTE — Progress Notes (Signed)
Patient stated 3-4 weeks ago she was feeling irregular contractions with small amount of bleeding, patient stated she has not felt anything since and has not seen anymore bleeding.

## 2016-08-03 LAB — GC/CHLAMYDIA PROBE AMP
CHLAMYDIA, DNA PROBE: NEGATIVE
Neisseria gonorrhoeae by PCR: NEGATIVE

## 2016-08-04 ENCOUNTER — Telehealth: Payer: Self-pay | Admitting: *Deleted

## 2016-08-04 ENCOUNTER — Encounter: Payer: Self-pay | Admitting: Family Medicine

## 2016-08-04 DIAGNOSIS — O99891 Other specified diseases and conditions complicating pregnancy: Secondary | ICD-10-CM | POA: Insufficient documentation

## 2016-08-04 DIAGNOSIS — O9989 Other specified diseases and conditions complicating pregnancy, childbirth and the puerperium: Secondary | ICD-10-CM

## 2016-08-04 DIAGNOSIS — R8271 Bacteriuria: Secondary | ICD-10-CM | POA: Insufficient documentation

## 2016-08-04 LAB — URINE CULTURE, OB REFLEX

## 2016-08-04 LAB — CULTURE, OB URINE

## 2016-08-04 NOTE — Telephone Encounter (Signed)
Patient aware of lab result and medication sent to pharmacy [facility] [address] [phone]

## 2016-08-05 ENCOUNTER — Other Ambulatory Visit: Payer: Self-pay

## 2016-08-05 ENCOUNTER — Other Ambulatory Visit: Payer: Self-pay | Admitting: *Deleted

## 2016-08-05 ENCOUNTER — Encounter: Payer: Self-pay | Admitting: Family Medicine

## 2016-08-05 DIAGNOSIS — Z3492 Encounter for supervision of normal pregnancy, unspecified, second trimester: Secondary | ICD-10-CM

## 2016-08-05 DIAGNOSIS — D582 Other hemoglobinopathies: Secondary | ICD-10-CM | POA: Insufficient documentation

## 2016-08-08 LAB — CYSTIC FIBROSIS MUTATION 97: Interpretation: NOT DETECTED

## 2016-08-08 LAB — PRENATAL PROFILE I(LABCORP)
Antibody Screen: NEGATIVE
BASOS ABS: 0 10*3/uL (ref 0.0–0.2)
Basos: 0 %
EOS (ABSOLUTE): 0 10*3/uL (ref 0.0–0.4)
Eos: 0 %
Hematocrit: 38.8 % (ref 34.0–46.6)
Hemoglobin: 13 g/dL (ref 11.1–15.9)
Hepatitis B Surface Ag: NEGATIVE
Immature Grans (Abs): 0 10*3/uL (ref 0.0–0.1)
Immature Granulocytes: 0 %
LYMPHS ABS: 0.5 10*3/uL — AB (ref 0.7–3.1)
Lymphs: 4 %
MCH: 29.5 pg (ref 26.6–33.0)
MCHC: 33.5 g/dL (ref 31.5–35.7)
MCV: 88 fL (ref 79–97)
Monocytes Absolute: 0.4 10*3/uL (ref 0.1–0.9)
Monocytes: 4 %
NEUTROS ABS: 10.7 10*3/uL — AB (ref 1.4–7.0)
Neutrophils: 92 %
PLATELETS: 342 10*3/uL (ref 150–379)
RBC: 4.41 x10E6/uL (ref 3.77–5.28)
RDW: 13.5 % (ref 12.3–15.4)
RH TYPE: POSITIVE
RPR Ser Ql: NONREACTIVE
Rubella Antibodies, IGG: 22.2 index (ref >0.99)
WBC: 11.7 10*3/uL — AB (ref 3.4–10.8)

## 2016-08-08 LAB — AFP, QUAD SCREEN
DIA Mom Value: 0.59
DIA Value (EIA): 104.79 pg/mL
DSR (BY AGE) 1 IN: 1159
DSR (SECOND TRIMESTER) 1 IN: 10000
Gestational Age: 17.6 WEEKS
MATERNAL AGE AT EDD: 20.3 a
MSAFP MOM: 2
MSAFP: 92 ng/mL
MSHCG Mom: 0.48
MSHCG: 14402 m[IU]/mL
OSB RISK: 1611
T18 (By Age): 1:4517 {titer}
TEST RESULTS AFP: NEGATIVE
UE3 VALUE: 1.49 ng/mL
WEIGHT: 143 [lb_av]
uE3 Mom: 1.21

## 2016-08-08 LAB — HEMOGLOBINOPATHY EVALUATION
HEMOGLOBIN A2 QUANTITATION: 3.2 % — AB (ref 0.7–3.1)
HGB C: 39.9 % — AB
HGB S: 0 %
Hemoglobin F Quantitation: 0 % (ref 0.0–2.0)
Hgb A: 56.9 % — ABNORMAL LOW (ref 94.0–98.0)

## 2016-08-08 LAB — HIV ANTIBODY (ROUTINE TESTING W REFLEX): HIV SCREEN 4TH GENERATION: NONREACTIVE

## 2016-08-08 LAB — TOXASSURE SELECT 13 (MW), URINE

## 2016-08-11 ENCOUNTER — Other Ambulatory Visit: Payer: Self-pay | Admitting: Family Medicine

## 2016-08-11 ENCOUNTER — Ambulatory Visit (HOSPITAL_COMMUNITY)
Admission: RE | Admit: 2016-08-11 | Discharge: 2016-08-11 | Disposition: A | Discharge status: Home / Self Care | Payer: Medicaid Other | Source: Ambulatory Visit | Attending: Family Medicine | Admitting: Family Medicine

## 2016-08-11 DIAGNOSIS — Z1389 Encounter for screening for other disorder: Secondary | ICD-10-CM

## 2016-08-11 DIAGNOSIS — Z3A19 19 weeks gestation of pregnancy: Secondary | ICD-10-CM | POA: Diagnosis not present

## 2016-08-11 DIAGNOSIS — Z3492 Encounter for supervision of normal pregnancy, unspecified, second trimester: Secondary | ICD-10-CM

## 2016-08-11 DIAGNOSIS — Z36 Encounter for antenatal screening of mother: Secondary | ICD-10-CM | POA: Diagnosis not present

## 2016-08-29 ENCOUNTER — Ambulatory Visit (INDEPENDENT_AMBULATORY_CARE_PROVIDER_SITE_OTHER): Payer: Medicaid Other | Admitting: Obstetrics and Gynecology

## 2016-08-29 VITALS — BP 108/67 | HR 82 | Temp 98.6°F | Wt 148.2 lb

## 2016-08-29 DIAGNOSIS — D582 Other hemoglobinopathies: Secondary | ICD-10-CM

## 2016-08-29 DIAGNOSIS — Z3400 Encounter for supervision of normal first pregnancy, unspecified trimester: Secondary | ICD-10-CM

## 2016-08-29 DIAGNOSIS — Z3402 Encounter for supervision of normal first pregnancy, second trimester: Secondary | ICD-10-CM | POA: Diagnosis not present

## 2016-08-29 NOTE — Progress Notes (Signed)
Patient states she is overall feeling good, reports good fetal movement.

## 2016-08-29 NOTE — Progress Notes (Signed)
Subjective:  Heather Vazquez is a 20 y.o. G1P0 at 8035w4d being seen today for ongoing prenatal care.  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  Patient reports no complaints.  Contractions: Not present. Vag. Bleeding: None.  Movement: Present. Denies leaking of fluid.   The following portions of the patient's history were reviewed and updated as appropriate: allergies, current medications, past family history, past medical history, past social history, past surgical history and problem list. Problem list updated.  Objective:   Vitals:   08/29/16 0837  BP: 108/67  Pulse: 82  Temp: 98.6 F (37 C)  Weight: 148 lb 3.2 oz (67.2 kg)    Fetal Status:     Movement: Present     General:  Alert, oriented and cooperative. Patient is in no acute distress.  Skin: Skin is warm and dry. No rash noted.   Cardiovascular: Normal heart rate noted  Respiratory: Normal respiratory effort, no problems with respiration noted  Abdomen: Soft, gravid, appropriate for gestational age. Pain/Pressure: Absent     Pelvic:  Cervical exam deferred        Extremities: Normal range of motion.  Edema: None  Mental Status: Normal mood and affect. Normal behavior. Normal judgment and thought content.   Urinalysis:      Assessment and Plan:  Pregnancy: G1P0 at 1035w4d  1. Supervision of normal first pregnancy, antepartum Declined flu vaccine UC for TOC today  2. Hemoglobin C trait (HCC)   Preterm labor symptoms and general obstetric precautions including but not limited to vaginal bleeding, contractions, leaking of fluid and fetal movement were reviewed in detail with the patient. Please refer to After Visit Summary for other counseling recommendations.  Return in about 4  weeks (around 09/26/2016) for OB visit.   Hermina StaggersMichael L Laurabeth Yip, MD

## 2016-09-01 LAB — URINE CULTURE, OB REFLEX

## 2016-09-01 LAB — CULTURE, OB URINE

## 2016-09-07 ENCOUNTER — Other Ambulatory Visit: Payer: Self-pay | Admitting: *Deleted

## 2016-09-07 DIAGNOSIS — N3000 Acute cystitis without hematuria: Secondary | ICD-10-CM

## 2016-09-07 MED ORDER — CEPHALEXIN 500 MG PO CAPS: 500.0000 mg | ORAL_CAPSULE | Freq: Three times a day (TID) | ORAL | 0 refills | Status: DC

## 2016-09-07 NOTE — Progress Notes (Signed)
Keflex

## 2016-09-26 ENCOUNTER — Encounter: Payer: Self-pay | Admitting: *Deleted

## 2016-09-26 ENCOUNTER — Ambulatory Visit (INDEPENDENT_AMBULATORY_CARE_PROVIDER_SITE_OTHER): Payer: Medicaid Other | Admitting: Obstetrics and Gynecology

## 2016-09-26 VITALS — BP 114/71 | HR 83 | Wt 151.0 lb

## 2016-09-26 DIAGNOSIS — N63 Unspecified lump in unspecified breast: Secondary | ICD-10-CM

## 2016-09-26 DIAGNOSIS — Z3402 Encounter for supervision of normal first pregnancy, second trimester: Secondary | ICD-10-CM

## 2016-09-26 DIAGNOSIS — D582 Other hemoglobinopathies: Secondary | ICD-10-CM

## 2016-09-26 DIAGNOSIS — Z3400 Encounter for supervision of normal first pregnancy, unspecified trimester: Secondary | ICD-10-CM

## 2016-09-26 NOTE — Progress Notes (Signed)
   PRENATAL VISIT NOTE  Subjective:  Heather Vazquez is a 20 y.o. G1P0 at 1965w4d being seen today for ongoing prenatal care.  She is currently monitored for the following issues for this low-risk pregnancy and has Supervision of normal pregnancy, antepartum and Hemoglobin C trait (HCC) on her problem list.  Patient reports the presence of a firm mass in her right breast which she noted on Saturday. The mass is not tender.  Contractions: Not present. Vag. Bleeding: None.  Movement: Present. Denies leaking of fluid.   The following portions of the patient's history were reviewed and updated as appropriate: allergies, current medications, past family history, past medical history, past social history, past surgical history and problem list. Problem list updated.  Objective:   Vitals:   09/26/16 0857  BP: 114/71  Pulse: 83  Weight: 151 lb (68.5 kg)    Fetal Status: Fetal Heart Rate (bpm): 144 Fundal Height: 26 cm Movement: Present     General:  Alert, oriented and cooperative. Patient is in no acute distress.  Skin: Skin is warm and dry. No rash noted.   Cardiovascular: Normal heart rate noted  Respiratory: Normal respiratory effort, no problems with respiration noted  Abdomen: Soft, gravid, appropriate for gestational age. Pain/Pressure: Absent     Pelvic:  Cervical exam deferred        Extremities: Normal range of motion.     Mental Status: Normal mood and affect. Normal behavior. Normal judgment and thought content.  BREASTS: Symmetric in size. No palpable lymphadenopathy, skin changes, or nipple drainage. Palpable 2 x 1 cm firm, mobile mass at 6 o'clock region inferior to aerola. No tenderness elicited on exam  Assessment and Plan:  Pregnancy: G1P0 at 1265w4d  1. Supervision of normal first pregnancy, antepartum Patient is doing well Reassurance provided right breast mass, likely fibroadenoma. Will refer for breast ultrasound  Third trimester labs and glucola at next visit - US BREAST  ASPIRATION RIGHT; Future  2. Hemoglobin C trait (HCC)   Preterm labor symptoms and general obstetric precautions including but not limited to vaginal bleeding, contractions, leaking of fluid and fetal movement were reviewed in detail with the patient. Please refer to After Visit Summary for other counseling recommendations.  Return in about 2 weeks (around 10/10/2016) for ROB with 2 hr glucola.   Catalina AntiguaPeggy Krew Hortman, MD

## 2016-10-11 ENCOUNTER — Ambulatory Visit (INDEPENDENT_AMBULATORY_CARE_PROVIDER_SITE_OTHER): Payer: Medicaid Other | Admitting: Obstetrics & Gynecology

## 2016-10-11 ENCOUNTER — Other Ambulatory Visit: Payer: Medicaid Other

## 2016-10-11 DIAGNOSIS — Z3490 Encounter for supervision of normal pregnancy, unspecified, unspecified trimester: Secondary | ICD-10-CM

## 2016-10-11 DIAGNOSIS — Z3400 Encounter for supervision of normal first pregnancy, unspecified trimester: Secondary | ICD-10-CM

## 2016-10-11 DIAGNOSIS — Z3402 Encounter for supervision of normal first pregnancy, second trimester: Secondary | ICD-10-CM

## 2016-10-11 NOTE — Progress Notes (Signed)
   PRENATAL VISIT NOTE  Subjective:  Heather Vazquez is a 20 y.o. G1P0 at 8525w5d being seen today for ongoing prenatal care.  She is currently monitored for the following issues for this low-risk pregnancy and has Supervision of normal pregnancy, antepartum and Hemoglobin C trait (HCC) on her problem list.  Patient reports no complaints.  Contractions: Not present. Vag. Bleeding: None.  Movement: Present. Denies leaking of fluid.   The following portions of the patient's history were reviewed and updated as appropriate: allergies, current medications, past family history, past medical history, past social history, past surgical history and problem list. Problem list updated.  Objective:   Vitals:   10/11/16 0845  BP: 115/76  Pulse: 80  Temp: 97.8 F (36.6 C)  Weight: 156 lb 3.2 oz (70.9 kg)    Fetal Status: Fetal Heart Rate (bpm): 135   Movement: Present     General:  Alert, oriented and cooperative. Patient is in no acute distress.  Skin: Skin is warm and dry. No rash noted.   Cardiovascular: Normal heart rate noted  Respiratory: Normal respiratory effort, no problems with respiration noted  Abdomen: Soft, gravid, appropriate for gestational age. Pain/Pressure: Absent     Pelvic:  Cervical exam deferred        Extremities: Normal range of motion.  Edema: None  Mental Status: Normal mood and affect. Normal behavior. Normal judgment and thought content.   Assessment and Plan:  Pregnancy: G1P0 at 3725w5d  1. Supervision of normal first pregnancy, antepartum 2 hr GTT  Preterm labor symptoms and general obstetric precautions including but not limited to vaginal bleeding, contractions, leaking of fluid and fetal movement were reviewed in detail with the patient. Please refer to After Visit Summary for other counseling recommendations.  Return in about 2 weeks (around 10/25/2016).   Adam PhenixJames G Ashayla Subia, MD

## 2016-10-11 NOTE — Patient Instructions (Signed)
Third Trimester of Pregnancy The third trimester is from week 29 through week 40 (months 7 through 9). The third trimester is a time when the unborn baby (fetus) is growing rapidly. At the end of the ninth month, the fetus is about 20 inches in length and weighs 6-10 pounds. Body changes during your third trimester Your body goes through many changes during pregnancy. The changes vary from woman to woman. During the third trimester:  Your weight will continue to increase. You can expect to gain 25-35 pounds (11-16 kg) by the end of the pregnancy.  You may begin to get stretch marks on your hips, abdomen, and breasts.  You may urinate more often because the fetus is moving lower into your pelvis and pressing on your bladder.  You may develop or continue to have heartburn. This is caused by increased hormones that slow down muscles in the digestive tract.  You may develop or continue to have constipation because increased hormones slow digestion and cause the muscles that push waste through your intestines to relax.  You may develop hemorrhoids. These are swollen veins (varicose veins) in the rectum that can itch or be painful.  You may develop swollen, bulging veins (varicose veins) in your legs.  You may have increased body aches in the pelvis, back, or thighs. This is due to weight gain and increased hormones that are relaxing your joints.  You may have changes in your hair. These can include thickening of your hair, rapid growth, and changes in texture. Some women also have hair loss during or after pregnancy, or hair that feels dry or thin. Your hair will most likely return to normal after your baby is born.  Your breasts will continue to grow and they will continue to become tender. A yellow fluid (colostrum) may leak from your breasts. This is the first milk you are producing for your baby.  Your belly button may stick out.  You may notice more swelling in your hands, face, or  ankles.  You may have increased tingling or numbness in your hands, arms, and legs. The skin on your belly may also feel numb.  You may feel short of breath because of your expanding uterus.  You may have more problems sleeping. This can be caused by the size of your belly, increased need to urinate, and an increase in your body's metabolism.  You may notice the fetus "dropping," or moving lower in your abdomen.  You may have increased vaginal discharge.  Your cervix becomes thin and soft (effaced) near your due date. What to expect at prenatal visits You will have prenatal exams every 2 weeks until week 36. Then you will have weekly prenatal exams. During a routine prenatal visit:  You will be weighed to make sure you and the fetus are growing normally.  Your blood pressure will be taken.  Your abdomen will be measured to track your baby's growth.  The fetal heartbeat will be listened to.  Any test results from the previous visit will be discussed.  You may have a cervical check near your due date to see if you have effaced. At around 36 weeks, your health care provider will check your cervix. At the same time, your health care provider will also perform a test on the secretions of the vaginal tissue. This test is to determine if a type of bacteria, Group B streptococcus, is present. Your health care provider will explain this further. Your health care provider may ask you:    What your birth plan is.  How you are feeling.  If you are feeling the baby move.  If you have had any abnormal symptoms, such as leaking fluid, bleeding, severe headaches, or abdominal cramping.  If you are using any tobacco products, including cigarettes, chewing tobacco, and electronic cigarettes.  If you have any questions. Other tests or screenings that may be performed during your third trimester include:  Blood tests that check for low iron levels (anemia).  Fetal testing to check the health,  activity level, and growth of the fetus. Testing is done if you have certain medical conditions or if there are problems during the pregnancy.  Nonstress test (NST). This test checks the health of your baby to make sure there are no signs of problems, such as the baby not getting enough oxygen. During this test, a belt is placed around your belly. The baby is made to move, and its heart rate is monitored during movement. What is false labor? False labor is a condition in which you feel small, irregular tightenings of the muscles in the womb (contractions) that eventually go away. These are called Braxton Hicks contractions. Contractions may last for hours, days, or even weeks before true labor sets in. If contractions come at regular intervals, become more frequent, increase in intensity, or become painful, you should see your health care provider. What are the signs of labor?  Abdominal cramps.  Regular contractions that start at 10 minutes apart and become stronger and more frequent with time.  Contractions that start on the top of the uterus and spread down to the lower abdomen and back.  Increased pelvic pressure and dull back pain.  A watery or bloody mucus discharge that comes from the vagina.  Leaking of amniotic fluid. This is also known as your "water breaking." It could be a slow trickle or a gush. Let your doctor know if it has a color or strange odor. If you have any of these signs, call your health care provider right away, even if it is before your due date. Follow these instructions at home: Eating and drinking  Continue to eat regular, healthy meals.  Do not eat:  Raw meat or meat spreads.  Unpasteurized milk or cheese.  Unpasteurized juice.  Store-made salad.  Refrigerated smoked seafood.  Hot dogs or deli meat, unless they are piping hot.  More than 6 ounces of albacore tuna a week.  Shark, swordfish, king mackerel, or tile fish.  Store-made salads.  Raw  sprouts, such as mung bean or alfalfa sprouts.  Take prenatal vitamins as told by your health care provider.  Take 1000 mg of calcium daily as told by your health care provider.  If you develop constipation:  Take over-the-counter or prescription medicines.  Drink enough fluid to keep your urine clear or pale yellow.  Eat foods that are high in fiber, such as fresh fruits and vegetables, whole grains, and beans.  Limit foods that are high in fat and processed sugars, such as fried and sweet foods. Activity  Exercise only as directed by your health care provider. Healthy pregnant women should aim for 2 hours and 30 minutes of moderate exercise per week. If you experience any pain or discomfort while exercising, stop.  Avoid heavy lifting.  Do not exercise in extreme heat or humidity, or at high altitudes.  Wear low-heel, comfortable shoes.  Practice good posture.  Do not travel far distances unless it is absolutely necessary and only with the approval   of your health care provider.  Wear your seat belt at all times while in a car, on a bus, or on a plane.  Take frequent breaks and rest with your legs elevated if you have leg cramps or low back pain.  Do not use hot tubs, steam rooms, or saunas.  You may continue to have sex unless your health care provider tells you otherwise. Lifestyle  Do not use any products that contain nicotine or tobacco, such as cigarettes and e-cigarettes. If you need help quitting, ask your health care provider.  Do not drink alcohol.  Do not use any medicinal herbs or unprescribed drugs. These chemicals affect the formation and growth of the baby.  If you develop varicose veins:  Wear support pantyhose or compression stockings as told by your healthcare provider.  Elevate your feet for 15 minutes, 3-4 times a day.  Wear a supportive maternity bra to help with breast tenderness. General instructions  Take over-the-counter and prescription  medicines only as told by your health care provider. There are medicines that are either safe or unsafe to take during pregnancy.  Take warm sitz baths to soothe any pain or discomfort caused by hemorrhoids. Use hemorrhoid cream or witch hazel if your health care provider approves.  Avoid cat litter boxes and soil used by cats. These carry germs that can cause birth defects in the baby. If you have a cat, ask someone to clean the litter box for you.  To prepare for the arrival of your baby:  Take prenatal classes to understand, practice, and ask questions about the labor and delivery.  Make a trial run to the hospital.  Visit the hospital and tour the maternity area.  Arrange for maternity or paternity leave through employers.  Arrange for family and friends to take care of pets while you are in the hospital.  Purchase a rear-facing car seat and make sure you know how to install it in your car.  Pack your hospital bag.  Prepare the baby's nursery. Make sure to remove all pillows and stuffed animals from the baby's crib to prevent suffocation.  Visit your dentist if you have not gone during your pregnancy. Use a soft toothbrush to brush your teeth and be gentle when you floss.  Keep all prenatal follow-up visits as told by your health care provider. This is important. Contact a health care provider if:  You are unsure if you are in labor or if your water has broken.  You become dizzy.  You have mild pelvic cramps, pelvic pressure, or nagging pain in your abdominal area.  You have lower back pain.  You have persistent nausea, vomiting, or diarrhea.  You have an unusual or bad smelling vaginal discharge.  You have pain when you urinate. Get help right away if:  You have a fever.  You are leaking fluid from your vagina.  You have spotting or bleeding from your vagina.  You have severe abdominal pain or cramping.  You have rapid weight loss or weight gain.  You have  shortness of breath with chest pain.  You notice sudden or extreme swelling of your face, hands, ankles, feet, or legs.  Your baby makes fewer than 10 movements in 2 hours.  You have severe headaches that do not go away with medicine.  You have vision changes. Summary  The third trimester is from week 29 through week 40, months 7 through 9. The third trimester is a time when the unborn baby (fetus)   is growing rapidly.  During the third trimester, your discomfort may increase as you and your baby continue to gain weight. You may have abdominal, leg, and back pain, sleeping problems, and an increased need to urinate.  During the third trimester your breasts will keep growing and they will continue to become tender. A yellow fluid (colostrum) may leak from your breasts. This is the first milk you are producing for your baby.  False labor is a condition in which you feel small, irregular tightenings of the muscles in the womb (contractions) that eventually go away. These are called Braxton Hicks contractions. Contractions may last for hours, days, or even weeks before true labor sets in.  Signs of labor can include: abdominal cramps; regular contractions that start at 10 minutes apart and become stronger and more frequent with time; watery or bloody mucus discharge that comes from the vagina; increased pelvic pressure and dull back pain; and leaking of amniotic fluid. This information is not intended to replace advice given to you by your health care provider. Make sure you discuss any questions you have with your health care provider. Document Released: 10/25/2001 Document Revised: 04/07/2016 Document Reviewed: 01/01/2013 Elsevier Interactive Patient Education  2017 [employer]  

## 2016-10-11 NOTE — Progress Notes (Signed)
Patient states that she feels good, reports good fetal movement. 

## 2016-10-12 LAB — GLUCOSE TOLERANCE, 2 HOURS W/ 1HR
GLUCOSE, FASTING: 66 mg/dL (ref 65–91)
Glucose, 1 hour: 132 mg/dL (ref 65–179)
Glucose, 2 hour: 91 mg/dL (ref 65–152)

## 2016-10-12 LAB — CBC
HEMATOCRIT: 34.3 % (ref 34.0–46.6)
Hemoglobin: 11.8 g/dL (ref 11.1–15.9)
MCH: 29.6 pg (ref 26.6–33.0)
MCHC: 34.4 g/dL (ref 31.5–35.7)
MCV: 86 fL (ref 79–97)
PLATELETS: 329 10*3/uL (ref 150–379)
RBC: 3.98 x10E6/uL (ref 3.77–5.28)
RDW: 14.3 % (ref 12.3–15.4)
WBC: 11.5 10*3/uL — ABNORMAL HIGH (ref 3.4–10.8)

## 2016-10-12 LAB — HIV ANTIBODY (ROUTINE TESTING W REFLEX): HIV Screen 4th Generation wRfx: NONREACTIVE

## 2016-10-12 LAB — RPR: RPR Ser Ql: NONREACTIVE

## 2016-10-25 ENCOUNTER — Ambulatory Visit (INDEPENDENT_AMBULATORY_CARE_PROVIDER_SITE_OTHER): Payer: Medicaid Other | Admitting: Obstetrics

## 2016-10-25 ENCOUNTER — Encounter: Payer: Self-pay | Admitting: Obstetrics

## 2016-10-25 VITALS — BP 118/69 | HR 89 | Wt 158.3 lb

## 2016-10-25 DIAGNOSIS — Z3400 Encounter for supervision of normal first pregnancy, unspecified trimester: Secondary | ICD-10-CM

## 2016-10-25 DIAGNOSIS — Z3403 Encounter for supervision of normal first pregnancy, third trimester: Secondary | ICD-10-CM

## 2016-10-25 NOTE — Progress Notes (Signed)
Subjective:    Heather Vazquez is a 20 y.o. female being seen today for her obstetrical visit. She is at 5340w5d gestation. Patient reports no complaints. Fetal movement: normal.  Problem List Items Addressed This Visit    None    Visit Diagnoses    Supervision of normal first pregnancy, antepartum    -  Primary     Patient Active Problem List   Diagnosis Date Noted  . Hemoglobin C trait (HCC) 08/05/2016  . Supervision of normal pregnancy, antepartum 07/31/2016   Objective:    BP 118/69   Pulse 89   Wt 158 lb 4.8 oz (71.8 kg)   LMP 02/27/2016 Comment: patient states unsure when her last period was.  BMI 28.95 kg/m  FHT:  150 BPM  Uterine Size: size equals dates  Presentation: cephalic     Assessment:    Pregnancy @ 5840w5d weeks   Plan:     labs reviewed, problem list updated Consent signed. GBS sent TDAP offered  Rhogam given for RH negative Pediatrician: discussed. Infant feeding: plans to breastfeed. Maternity leave: discussed. Cigarette smoking: never smoked. No orders of the defined types were placed in this encounter.  No orders of the defined types were placed in this encounter.  Follow up in 2 Weeks.

## 2016-10-25 NOTE — Addendum Note (Signed)
Addended by: Anell BarrHOWARD, JENNIFER L on: 10/25/2016 01:10 PM   Modules accepted: Orders

## 2016-11-10 ENCOUNTER — Other Ambulatory Visit: Payer: Medicaid Other

## 2016-11-14 NOTE — L&D Delivery Note (Signed)
OB Delivery Summary  21 y.o G1P0 at 469w6d delivered a viable female infant in cephalic, LOA position. There was a nuchal cord, that was delivered through and then easily reduced. The anterior shoulder delivered with ease. A  60 sec delayed cord clamping was performed. Cord clamped x2 and cut. Placenta delivered spontaneously intact, with 3VC. Fundus firm on exam with massage and pitocin. Good hemostasis noted.  Anesthesia: Epidural Laceration: None Suture: N/A Good hemostasis noted. EBL: 100cc  Mom and baby recovering in LDR.    Apgars: APGAR (1 MIN): 8   APGAR (5 MINS):  9 Weight: Pending skin to skin    Gorden HarmsMegan Campbell, MD PGY-2 12/14/2016, 4:20 AM  I was gloved and present for entire delivery SVD without incident No difficulty with shoulders No lacerations  Aviva SignsMarie L Williams, CNM

## 2016-11-17 ENCOUNTER — Ambulatory Visit
Admission: RE | Admit: 2016-11-17 | Discharge: 2016-11-17 | Disposition: A | Discharge status: Home / Self Care | Payer: Medicaid Other | Source: Ambulatory Visit | Attending: Obstetrics and Gynecology | Admitting: Obstetrics and Gynecology

## 2016-11-17 DIAGNOSIS — N63 Unspecified lump in unspecified breast: Secondary | ICD-10-CM

## 2016-11-22 ENCOUNTER — Encounter: Payer: Medicaid Other | Admitting: Obstetrics and Gynecology

## 2016-11-27 ENCOUNTER — Inpatient Hospital Stay (HOSPITAL_COMMUNITY)
Admission: AD | Admit: 2016-11-27 | Discharge: 2016-11-28 | Disposition: A | Discharge status: Home / Self Care | Payer: Medicaid Other | Source: Ambulatory Visit | Attending: Obstetrics and Gynecology | Admitting: Obstetrics and Gynecology

## 2016-11-27 DIAGNOSIS — Z3A34 34 weeks gestation of pregnancy: Secondary | ICD-10-CM | POA: Diagnosis not present

## 2016-11-27 DIAGNOSIS — O4693 Antepartum hemorrhage, unspecified, third trimester: Secondary | ICD-10-CM | POA: Insufficient documentation

## 2016-11-27 LAB — URINALYSIS, MICROSCOPIC (REFLEX)

## 2016-11-27 LAB — URINALYSIS, ROUTINE W REFLEX MICROSCOPIC
GLUCOSE, UA: 100 mg/dL — AB
Ketones, ur: 15 mg/dL — AB
Nitrite: POSITIVE — AB
PH: 6.5 (ref 5.0–8.0)
Protein, ur: 100 mg/dL — AB
SPECIFIC GRAVITY, URINE: 1.025 (ref 1.005–1.030)

## 2016-11-27 NOTE — MAU Note (Signed)
Pt noticed red spotting after voiding this evening @ 1900 then again at 2300. Reports some mild, low abd, cramping with the  Spotting. Denies any other urinary sympioms, vag discharge or leaking. State the baby is active.

## 2016-11-28 DIAGNOSIS — O4693 Antepartum hemorrhage, unspecified, third trimester: Secondary | ICD-10-CM | POA: Diagnosis not present

## 2016-11-28 LAB — GC/CHLAMYDIA PROBE AMP (~~LOC~~) NOT AT ARMC
Chlamydia: NEGATIVE
NEISSERIA GONORRHEA: NEGATIVE

## 2016-11-28 LAB — WET PREP, GENITAL
CLUE CELLS WET PREP: NONE SEEN
Sperm: NONE SEEN
Trich, Wet Prep: NONE SEEN
YEAST WET PREP: NONE SEEN

## 2016-12-05 ENCOUNTER — Encounter: Payer: Self-pay | Admitting: Obstetrics

## 2016-12-05 ENCOUNTER — Ambulatory Visit (INDEPENDENT_AMBULATORY_CARE_PROVIDER_SITE_OTHER): Payer: Medicaid Other | Admitting: Obstetrics

## 2016-12-05 VITALS — BP 116/75 | HR 106 | Wt 159.0 lb

## 2016-12-05 DIAGNOSIS — Z23 Encounter for immunization: Secondary | ICD-10-CM

## 2016-12-05 DIAGNOSIS — Z3480 Encounter for supervision of other normal pregnancy, unspecified trimester: Secondary | ICD-10-CM

## 2016-12-05 DIAGNOSIS — Z3483 Encounter for supervision of other normal pregnancy, third trimester: Secondary | ICD-10-CM

## 2016-12-05 DIAGNOSIS — Z3400 Encounter for supervision of normal first pregnancy, unspecified trimester: Secondary | ICD-10-CM

## 2016-12-05 NOTE — Progress Notes (Signed)
Subjective:    Oluwatoyin Madilyn FiremanHayes is a 21 y.o. female being seen today for her obstetrical visit. She is at 8572w4d gestation. Patient reports no complaints. Fetal movement: normal.  Problem List Items Addressed This Visit    Supervision of normal pregnancy, antepartum - Primary   Relevant Orders   Strep Gp B NAA     Patient Active Problem List   Diagnosis Date Noted  . Hemoglobin C trait (HCC) 08/05/2016  . Supervision of normal pregnancy, antepartum 07/31/2016   Objective:    BP 116/75   Pulse (!) 106   Wt 159 lb (72.1 kg)   LMP 02/27/2016 Comment: patient states unsure when her last period was.  BMI 29.08 kg/m  FHT:  150 BPM  Uterine Size: size equals dates  Presentation: unsure     Assessment:    Pregnancy @ 3172w4d weeks   Plan:     labs reviewed, problem list updated Consent signed. GBS sent TDAP offered  Rhogam given for RH negative Pediatrician: discussed. Infant feeding: plans to breastfeed. Maternity leave: discussed. Cigarette smoking: never smoked. Orders Placed This Encounter  Procedures  . Strep Gp B NAA  . Tdap vaccine greater than or equal to 7yo IM   No orders of the defined types were placed in this encounter.  Follow up in 2 Weeks.   Patient ID: Candace GallusMyah Siebenaler, female   DOB: 08/30/1996, 21 y.o.   MRN: [id number]

## 2016-12-07 LAB — STREP GP B NAA: Strep Gp B NAA: NEGATIVE

## 2016-12-13 ENCOUNTER — Inpatient Hospital Stay (HOSPITAL_COMMUNITY)
Admission: AD | Admit: 2016-12-13 | Discharge: 2016-12-16 | DRG: 775 | Disposition: A | Discharge status: Home / Self Care | Payer: Medicaid Other | Source: Ambulatory Visit | Attending: Obstetrics and Gynecology | Admitting: Obstetrics and Gynecology

## 2016-12-13 ENCOUNTER — Encounter (HOSPITAL_COMMUNITY): Payer: Self-pay

## 2016-12-13 ENCOUNTER — Inpatient Hospital Stay (HOSPITAL_COMMUNITY): Payer: Medicaid Other | Admitting: Anesthesiology

## 2016-12-13 DIAGNOSIS — Z3A36 36 weeks gestation of pregnancy: Secondary | ICD-10-CM

## 2016-12-13 DIAGNOSIS — D573 Sickle-cell trait: Secondary | ICD-10-CM | POA: Diagnosis present

## 2016-12-13 DIAGNOSIS — O42013 Preterm premature rupture of membranes, onset of labor within 24 hours of rupture, third trimester: Secondary | ICD-10-CM | POA: Diagnosis not present

## 2016-12-13 DIAGNOSIS — Z3490 Encounter for supervision of normal pregnancy, unspecified, unspecified trimester: Secondary | ICD-10-CM

## 2016-12-13 LAB — URINALYSIS, ROUTINE W REFLEX MICROSCOPIC
BILIRUBIN URINE: NEGATIVE
GLUCOSE, UA: NEGATIVE mg/dL
KETONES UR: 80 mg/dL — AB
Nitrite: NEGATIVE
PH: 6 (ref 5.0–8.0)
Protein, ur: NEGATIVE mg/dL
Specific Gravity, Urine: 1.015 (ref 1.005–1.030)

## 2016-12-13 LAB — CBC
HEMATOCRIT: 34.8 % — AB (ref 36.0–46.0)
HEMOGLOBIN: 12.4 g/dL (ref 12.0–15.0)
MCH: 29 pg (ref 26.0–34.0)
MCHC: 35.6 g/dL (ref 30.0–36.0)
MCV: 81.5 fL (ref 78.0–100.0)
Platelets: 312 10*3/uL (ref 150–400)
RBC: 4.27 MIL/uL (ref 3.87–5.11)
RDW: 13.5 % (ref 11.5–15.5)
WBC: 15.3 10*3/uL — AB (ref 4.0–10.5)

## 2016-12-13 LAB — TYPE AND SCREEN
ABO/RH(D): O POS
ANTIBODY SCREEN: NEGATIVE

## 2016-12-13 MED ORDER — PHENYLEPHRINE 40 MCG/ML (10ML) SYRINGE FOR IV PUSH (FOR BLOOD PRESSURE SUPPORT)
80.0000 ug | PREFILLED_SYRINGE | INTRAVENOUS | Status: DC | PRN
  Filled 2016-12-13: qty 5
  Filled 2016-12-13: qty 10

## 2016-12-13 MED ORDER — FENTANYL CITRATE (PF) 100 MCG/2ML IJ SOLN
100.0000 ug | INTRAMUSCULAR | Status: DC | PRN
  Administered 2016-12-13: 100 ug via INTRAVENOUS
  Filled 2016-12-13: qty 2

## 2016-12-13 MED ORDER — OXYCODONE-ACETAMINOPHEN 5-325 MG PO TABS: 1.0000 | ORAL_TABLET | ORAL | Status: DC | PRN

## 2016-12-13 MED ORDER — OXYCODONE-ACETAMINOPHEN 5-325 MG PO TABS
1.0000 | ORAL_TABLET | Freq: Once | ORAL | Status: AC
  Administered 2016-12-13: 1 via ORAL
  Filled 2016-12-13: qty 1

## 2016-12-13 MED ORDER — LACTATED RINGERS IV BOLUS (SEPSIS): 1000.0000 mL | Freq: Once | INTRAVENOUS | Status: DC

## 2016-12-13 MED ORDER — FENTANYL CITRATE (PF) 100 MCG/2ML IJ SOLN: 100.0000 ug | Freq: Once | INTRAMUSCULAR | Status: DC

## 2016-12-13 MED ORDER — LACTATED RINGERS IV SOLN
INTRAVENOUS | Status: DC
  Administered 2016-12-13: 23:00:00 via INTRAVENOUS

## 2016-12-13 MED ORDER — LACTATED RINGERS IV SOLN: 500.0000 mL | INTRAVENOUS | Status: DC | PRN

## 2016-12-13 MED ORDER — DIPHENHYDRAMINE HCL 50 MG/ML IJ SOLN: 12.5000 mg | INTRAMUSCULAR | Status: DC | PRN

## 2016-12-13 MED ORDER — LACTATED RINGERS IV SOLN
500.0000 mL | Freq: Once | INTRAVENOUS | Status: AC
  Administered 2016-12-13: 500 mL via INTRAVENOUS

## 2016-12-13 MED ORDER — OXYTOCIN 40 UNITS IN LACTATED RINGERS INFUSION - SIMPLE MED: 2.5000 [IU]/h | INTRAVENOUS | Status: DC

## 2016-12-13 MED ORDER — OXYTOCIN BOLUS FROM INFUSION
500.0000 mL | Freq: Once | INTRAVENOUS | Status: AC
Start: 2016-12-13 — End: 2016-12-14
  Administered 2016-12-14: 500 mL via INTRAVENOUS

## 2016-12-13 MED ORDER — EPHEDRINE 5 MG/ML INJ
10.0000 mg | INTRAVENOUS | Status: DC | PRN
  Filled 2016-12-13: qty 4

## 2016-12-13 MED ORDER — ONDANSETRON HCL 4 MG/2ML IJ SOLN: 4.0000 mg | Freq: Four times a day (QID) | INTRAMUSCULAR | Status: DC | PRN

## 2016-12-13 MED ORDER — FLEET ENEMA 7-19 GM/118ML RE ENEM: 1.0000 | ENEMA | RECTAL | Status: DC | PRN

## 2016-12-13 MED ORDER — PHENYLEPHRINE 40 MCG/ML (10ML) SYRINGE FOR IV PUSH (FOR BLOOD PRESSURE SUPPORT)
80.0000 ug | PREFILLED_SYRINGE | INTRAVENOUS | Status: DC | PRN
Start: 2016-12-13 — End: 2016-12-14
  Filled 2016-12-13: qty 5

## 2016-12-13 MED ORDER — BETAMETHASONE SOD PHOS & ACET 6 (3-3) MG/ML IJ SUSP
12.0000 mg | Freq: Once | INTRAMUSCULAR | Status: AC
  Administered 2016-12-13: 12 mg via INTRAMUSCULAR
  Filled 2016-12-13: qty 2

## 2016-12-13 MED ORDER — FENTANYL 2.5 MCG/ML BUPIVACAINE 1/10 % EPIDURAL INFUSION (WH - ANES)
14.0000 mL/h | INTRAMUSCULAR | Status: DC | PRN
  Administered 2016-12-13: 14 mL/h via EPIDURAL
  Filled 2016-12-13: qty 100

## 2016-12-13 MED ORDER — SOD CITRATE-CITRIC ACID 500-334 MG/5ML PO SOLN: 30.0000 mL | ORAL | Status: DC | PRN

## 2016-12-13 MED ORDER — LIDOCAINE HCL (PF) 1 % IJ SOLN
30.0000 mL | INTRAMUSCULAR | Status: AC | PRN
  Administered 2016-12-14: 30 mL via SUBCUTANEOUS
  Filled 2016-12-13: qty 30

## 2016-12-13 MED ORDER — ACETAMINOPHEN 325 MG PO TABS: 650.0000 mg | ORAL_TABLET | ORAL | Status: DC | PRN

## 2016-12-13 MED ORDER — LIDOCAINE HCL (PF) 1 % IJ SOLN
INTRAMUSCULAR | Status: DC | PRN
  Administered 2016-12-13: 6 mL via EPIDURAL
  Administered 2016-12-13: 4 mL

## 2016-12-13 MED ORDER — FENTANYL CITRATE (PF) 100 MCG/2ML IJ SOLN: 50.0000 ug | INTRAMUSCULAR | Status: DC | PRN

## 2016-12-13 NOTE — Progress Notes (Addendum)
G1@ 36+[redacted] wksga. Presents to triage for ctx progressively getting worse today. Denies LOF or bleeding but states mucus pink discharge. States had intercourse past Saturday. +FM. EFM applied. VSS. See flow sheet for details.   1906: SVE: 2/50/0. Scant bloody discharge on gloves noted   1914: MD notified and left message  1950: Provider called again. 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  1952: Faculty group notified. Report status of pt given. Orders received to recheck cervix hr from last exam.   2027: Provider notified. Report status of pt given. Orders received to recheck cervix in 1 hour from last exam. Addressed pain. Provider stated will order pain medication.  2056: Provider notified for clarification of fentanyl order dt pt not having Iv.   Pt updated on POC.  2100: up to bathroom and voided   2121: Provider notified. Report status of pt given. Orders received to admit.   2146: Labs drawn and IV started 20 gauge L-wrist. Infusing LR bolus. Pt tolerated it well. Labs tubed to lab  2200: Pt transferred to L&D via wheelchair.

## 2016-12-13 NOTE — Anesthesia Preprocedure Evaluation (Signed)

## 2016-12-13 NOTE — H&P (Signed)
LABOR AND DELIVERY ADMISSION HISTORY AND PHYSICAL NOTE  Heather Vazquez is a 21 y.o. female G1P0 with IUP at 6582w5d by first trimester ultrasound presenting for spontaneous onset of labor. She reports the onset of contractions that have been increasing in intensity and frequency throughout the day. She presented to the MAU and had persistent cervical change.    She reports positive fetal movement. She denies leakage of fluid or vaginal bleeding.  Prenatal History/Complications: Hemoglobin C trait  Past Medical History: Past Medical History:  Diagnosis Date  . Headache   . Heart murmur     Past Surgical History: Past Surgical History:  Procedure Laterality Date  . NO PAST SURGERIES      Obstetrical History: OB History    Gravida Para Term Preterm AB Living   1             SAB TAB Ectopic Multiple Live Births                  Social History: Social History   Social History  . Marital status: Single    Spouse name: N/A  . Number of children: N/A  . Years of education: N/A   Social History Main Topics  . Smoking status: Never Smoker  . Smokeless tobacco: Never Used  . Alcohol use No  . Drug use: No  . Sexual activity: Yes    Birth control/ protection: None   Other Topics Concern  . None   Social History Narrative  . None    Family History: Family History  Problem Relation Age of Onset  . Cancer Maternal Grandmother   . Cancer Maternal Grandfather   . Cancer Paternal Grandmother   . Cancer Paternal Grandfather     Allergies: Allergies  Allergen Reactions  . Peanut-Containing Drug Products Shortness Of Breath, Swelling and Other (See Comments)    Reaction:  Tongue swelling    Prescriptions Prior to Admission  Medication Sig Dispense Refill Last Dose  . acetaminophen (TYLENOL) 500 MG tablet Take 500 mg by mouth every 6 (six) hours as needed for mild pain.   12/12/2016 at Unknown time  . Prenatal Vit-Fe Fumarate-FA (PRENATAL COMPLETE) 14-0.4 MG TABS Take 1  tablet by mouth daily. 60 each 0 Past Month at Unknown time     Review of Systems   All systems reviewed and negative except as stated in HPI  Blood pressure 133/80, pulse 102, temperature 98.3 F (36.8 C), temperature source Oral, resp. rate 20, height 5\' 2"  (1.575 m), weight 161 lb (73 kg), last menstrual period 02/27/2016. General appearance: alert, cooperative and no distress Lungs: clear to auscultation bilaterally Heart: regular rate and rhythm Abdomen: soft, non-tender; bowel sounds normal Extremities: No calf swelling or tenderness Presentation: cephalic Fetal monitoring: FHT with baseline in the 130s, + accels, - decels, mod variability Uterine activity: ctx q 3-4 minutes Dilation: 4 Effacement (%): 60 Station: 0 Exam by:: Janeen mcClellan - MAU   Prenatal labs: ABO, Rh: O/Positive/-- (09/18 1126) Antibody: Negative (09/18 1126) Rubella: Immune RPR: Non Reactive (11/28 1035)  HBsAg: Negative (09/18 1126)  HIV: Non Reactive (11/28 1035)  GBS: Negative (01/22 1400)  2-hr GTT: normal Genetic screening: normal Anatomy US: normal  Prenatal Transfer Tool  Maternal Diabetes: No Genetic Screening: Normal Maternal Ultrasounds/Referrals: Normal Fetal Ultrasounds or other Referrals:  None Maternal Substance Abuse:  No Significant Maternal Medications:  None Significant Maternal Lab Results: Lab values include: Group B Strep negative  Results for orders placed or performed  during the hospital encounter of 12/13/16 (from the past 24 hour(s))  Urinalysis, Routine w reflex microscopic   Collection Time: 12/13/16  6:39 PM  Result Value Ref Range   Color, Urine YELLOW YELLOW   APPearance CLEAR CLEAR   Specific Gravity, Urine 1.015 1.005 - 1.030   pH 6.0 5.0 - 8.0   Glucose, UA NEGATIVE NEGATIVE mg/dL   Hgb urine dipstick LARGE (A) NEGATIVE   Bilirubin Urine NEGATIVE NEGATIVE   Ketones, ur 80 (A) NEGATIVE mg/dL   Protein, ur NEGATIVE NEGATIVE mg/dL   Nitrite NEGATIVE  NEGATIVE   Leukocytes, UA TRACE (A) NEGATIVE   RBC / HPF 6-30 0 - 5 RBC/hpf   WBC, UA 6-30 0 - 5 WBC/hpf   Bacteria, UA RARE (A) NONE SEEN   Squamous Epithelial / LPF 6-30 (A) NONE SEEN   Mucous PRESENT   CBC   Collection Time: 12/13/16  9:35 PM  Result Value Ref Range   WBC 15.3 (H) 4.0 - 10.5 K/uL   RBC 4.27 3.87 - 5.11 MIL/uL   Hemoglobin 12.4 12.0 - 15.0 g/dL   HCT 16.1 (L) 09.6 - 04.5 %   MCV 81.5 78.0 - 100.0 fL   MCH 29.0 26.0 - 34.0 pg   MCHC 35.6 30.0 - 36.0 g/dL   RDW 40.9 81.1 - 91.4 %   Platelets 312 150 - 400 K/uL    Patient Active Problem List   Diagnosis Date Noted  . Pregnant and not yet delivered 12/13/2016  . Hemoglobin C trait (HCC) 08/05/2016  . Supervision of normal pregnancy, antepartum 07/31/2016    Assessment: Heather Vazquez is a 21 y.o. G1P0 at [redacted]w[redacted]d here for spontaneous onset of labor.   #labor: Expectant management until midnight. Betamethasone x 1 since patient is technically preterm at this time. Patient progressing well at this time, do not anticipate need for augmentation.  #Pain: Desires an epidural #FWB: Cat I #ID:  GBS Neg- no abx indicated #MOF: Breast #MOC:Depo injections #Circ:  N/A  Lise Auer, MD PGY-2 12/13/2016, 10:27 PM  The patient was seen and examined by me also Agree with note NST reactive and reassuring UCs as listed Cervical exams as listed in note  Aviva Signs, CNM

## 2016-12-13 NOTE — Anesthesia Procedure Notes (Signed)
Epidural Patient location during procedure: OB  Staffing Anesthesiologist: Jadie Allington  Preanesthetic Checklist Completed: patient identified, site marked, surgical consent, pre-op evaluation, timeout performed, IV checked, risks and benefits discussed and monitors and equipment checked  Epidural Patient position: sitting Prep: site prepped and draped and DuraPrep Patient monitoring: continuous pulse ox and blood pressure Approach: midline Location: L3-L4 Injection technique: LOR air  Needle:  Needle type: Tuohy  Needle gauge: 17 G Needle length: 9 cm and 9 Needle insertion depth: 5 cm cm Catheter type: closed end flexible Catheter size: 19 Gauge Catheter at skin depth: 10 cm Test dose: negative  Assessment Events: blood not aspirated, injection not painful, no injection resistance, negative IV test and no paresthesia  Additional Notes Dosing of Epidural:  1st dose, through catheter .............................................  Xylocaine 40 mg  2nd dose, through catheter, after waiting 3 minutes.........Xylocaine 60 mg    As each dose occurred, patient was free of IV sx; and patient exhibited no evidence of SA injection.  Patient is more comfortable after epidural dosed. Please see RN's note for documentation of vital signs,and FHR which are stable.  Patient reminded not to try to ambulate with numb legs, and that an RN must be present when she attempts to get up.        

## 2016-12-13 NOTE — MAU Note (Signed)
Been having really bad contractions. Started last night, have gotten worse through out the day.  No bleeding or leaking.

## 2016-12-14 ENCOUNTER — Encounter (HOSPITAL_COMMUNITY): Payer: Self-pay

## 2016-12-14 DIAGNOSIS — Z3A36 36 weeks gestation of pregnancy: Secondary | ICD-10-CM

## 2016-12-14 DIAGNOSIS — O42013 Preterm premature rupture of membranes, onset of labor within 24 hours of rupture, third trimester: Secondary | ICD-10-CM

## 2016-12-14 LAB — RPR: RPR: NONREACTIVE

## 2016-12-14 LAB — ABO/RH: ABO/RH(D): O POS

## 2016-12-14 MED ORDER — TERBUTALINE SULFATE 1 MG/ML IJ SOLN
0.2500 mg | Freq: Once | INTRAMUSCULAR | Status: DC | PRN
  Filled 2016-12-14: qty 1

## 2016-12-14 MED ORDER — DIBUCAINE 1 % RE OINT: 1.0000 "application " | TOPICAL_OINTMENT | RECTAL | Status: DC | PRN

## 2016-12-14 MED ORDER — COCONUT OIL OIL: 1.0000 "application " | TOPICAL_OIL | Status: DC | PRN

## 2016-12-14 MED ORDER — WITCH HAZEL-GLYCERIN EX PADS: 1.0000 "application " | MEDICATED_PAD | CUTANEOUS | Status: DC | PRN

## 2016-12-14 MED ORDER — TETANUS-DIPHTH-ACELL PERTUSSIS 5-2.5-18.5 LF-MCG/0.5 IM SUSP: 0.5000 mL | Freq: Once | INTRAMUSCULAR | Status: DC

## 2016-12-14 MED ORDER — ONDANSETRON HCL 4 MG/2ML IJ SOLN: 4.0000 mg | INTRAMUSCULAR | Status: DC | PRN

## 2016-12-14 MED ORDER — ZOLPIDEM TARTRATE 5 MG PO TABS: 5.0000 mg | ORAL_TABLET | Freq: Every evening | ORAL | Status: DC | PRN

## 2016-12-14 MED ORDER — SENNOSIDES-DOCUSATE SODIUM 8.6-50 MG PO TABS
2.0000 | ORAL_TABLET | ORAL | Status: DC
  Administered 2016-12-15 (×2): 2 via ORAL
  Filled 2016-12-14 (×2): qty 2

## 2016-12-14 MED ORDER — SIMETHICONE 80 MG PO CHEW: 80.0000 mg | CHEWABLE_TABLET | ORAL | Status: DC | PRN

## 2016-12-14 MED ORDER — PRENATAL MULTIVITAMIN CH
1.0000 | ORAL_TABLET | Freq: Every day | ORAL | Status: DC
  Administered 2016-12-14 – 2016-12-15 (×2): 1 via ORAL
  Filled 2016-12-14 (×2): qty 1

## 2016-12-14 MED ORDER — ONDANSETRON HCL 4 MG PO TABS: 4.0000 mg | ORAL_TABLET | ORAL | Status: DC | PRN

## 2016-12-14 MED ORDER — OXYTOCIN 40 UNITS IN LACTATED RINGERS INFUSION - SIMPLE MED
1.0000 m[IU]/min | INTRAVENOUS | Status: DC
  Administered 2016-12-14: 2 m[IU]/min via INTRAVENOUS
  Filled 2016-12-14: qty 1000

## 2016-12-14 MED ORDER — DIPHENHYDRAMINE HCL 25 MG PO CAPS: 25.0000 mg | ORAL_CAPSULE | Freq: Four times a day (QID) | ORAL | Status: DC | PRN

## 2016-12-14 MED ORDER — BENZOCAINE-MENTHOL 20-0.5 % EX AERO: 1.0000 "application " | INHALATION_SPRAY | CUTANEOUS | Status: DC | PRN

## 2016-12-14 MED ORDER — IBUPROFEN 600 MG PO TABS
600.0000 mg | ORAL_TABLET | Freq: Four times a day (QID) | ORAL | Status: DC
  Administered 2016-12-14 – 2016-12-16 (×9): 600 mg via ORAL
  Filled 2016-12-14 (×9): qty 1

## 2016-12-14 MED ORDER — ACETAMINOPHEN 325 MG PO TABS
650.0000 mg | ORAL_TABLET | ORAL | Status: DC | PRN
  Filled 2016-12-14: qty 2

## 2016-12-14 NOTE — Progress Notes (Signed)
UR chart review completed.  

## 2016-12-14 NOTE — Anesthesia Postprocedure Evaluation (Signed)
Anesthesia Post Note  Patient: Heather Vazquez  Procedure(s) Performed: * No procedures listed *  Patient location during evaluation: Mother Baby Anesthesia Type: Epidural Level of consciousness: awake Pain management: satisfactory to patient Vital Signs Assessment: post-procedure vital signs reviewed and stable Respiratory status: spontaneous breathing Cardiovascular status: stable Anesthetic complications: no        Last Vitals:  Vitals:   12/14/16 0650 12/14/16 1030  BP: 127/64 (!) 114/54  Pulse: 93 84  Resp: 18 18  Temp: 37.2 C 37.4 C    Last Pain:  Vitals:   12/14/16 1030  TempSrc: Oral  PainSc:    Pain Goal:                 KeyCorpBURGER,Elizzie Westergard

## 2016-12-14 NOTE — Lactation Note (Signed)
This note was copied from a baby's chart. Lactation Consultation Note  Patient Name: Heather Vazquez ZOXWR'UToday's Date: 12/14/2016 Reason for consult: Initial assessment;Late preterm infant  Visited with first time Mom of late preterm infant born at 5562w6d weighing 5 lbs 9.8 oz.  Mom states baby has breast fed twice and did really well, but acted hungry afterwards.. Explained normal newborn behavior for late preterm infants. Encouraged keeping baby STS.  RN gave baby latch scores of 7 and 9.  Mom set up with DEBP and is aware of importance of post BFing pumping on initiation setting to support her milk supply.  Baby started on Alimentum supplementation via syringe 3 and 5 ml.  Explained about supplementation volume 5-10 ml today, and then it increases after 24 hrs and each 24 hrs following that.  Encouraged STS and feeding baby at least every 3 hrs and earlier if she is hungry.  Encouraged to use her EBM when able to express some, encouraged hand expression and breast massage. Offered to assist a feeding, and encouraged to call prn for help. LPT handout given, and Lactation Brochure left with Mom.  Informed Mom of IP and OP lactation services available to her.  LC to follow up tomorrow.  Consult Status Consult Status: Follow-up Date: 12/15/16 Follow-up type: In-patient    Heather Vazquez, Johan Antonacci E 12/14/2016, 2:02 PM

## 2016-12-14 NOTE — Progress Notes (Signed)
S: Patient seen & examined for progress of labor. Patient comfortable with epidural.    O:  Vitals:   12/14/16 0145 12/14/16 0150 12/14/16 0155 12/14/16 0201  BP:    123/78  Pulse: 98 (!) 104 98 98  Resp:    18  Temp:    98.6 F (37 C)  TempSrc:    Oral  SpO2: 95% 96% 98%   Weight:      Height:        Dilation: 4 Effacement (%): 90 Cervical Position: Anterior Station: 0 Presentation: Vertex Exam by:: Barrie DunkerMurayyah Johnson RN   FHT: 130s bpm, mod var, +accels, no decels TOCO: q413min   A/P: Cervical exam with limited progression Will augment labor with pitocin Continue expectant management Anticipate SVD   Gorden HarmsMegan Adahlia Stembridge, MD PGY-2 12/14/2016 2:24 AM

## 2016-12-15 ENCOUNTER — Encounter: Payer: Medicaid Other | Admitting: Obstetrics

## 2016-12-15 MED ORDER — IBUPROFEN 600 MG PO TABS: 600.0000 mg | ORAL_TABLET | Freq: Four times a day (QID) | ORAL | 0 refills | Status: AC | PRN

## 2016-12-15 NOTE — Lactation Note (Signed)
This note was copied from a baby's chart. Lactation Consultation Note: Mother reports that infant just had a 10 min feeding and father of baby is now feeding infant a bottle. Infant took 12 ml of Alementium. Encouraged mother to breastfeed up to 30 mins. Advised mother to post pump every 2-3 hours 6-8 times in 24 hours. Advised to observe infants feeding cues. Discussed supply and demand and reason for post pumping. Mother is active with WIC. [facility] Advised mother to page for latch to be assessed with next feeding.   Patient Name: Girl Surie Madilyn FiremanHayes ZOXWR'UToday's Date: 12/15/2016 Reason for consult: Follow-up assessment   Maternal Data    Feeding Feeding Type: Breast Fed Nipple Type: Slow - flow Length of feed: 10 min (per mother)  [facility] Score/Interventions                      Lactation Tools Discussed/Used     Consult Status      Stevan BornKendrick, Suhailah Kwan McCoy 12/15/2016, 2:08 PM

## 2016-12-15 NOTE — Discharge Summary (Signed)
OB Discharge Summary     Patient Name: Heather Vazquez DOB: 11/26/1995 MRN: [id number]  Date of admission: 12/13/2016 Delivering MD: Gorden HarmsAMPBELL, MEGAN C   Date of discharge: 12/15/2016  Admitting diagnosis: 36.5wks bad cramps, ctx Intrauterine pregnancy: 5239w6d     Secondary diagnosis:  Active Problems:   Pregnant and not yet delivered  Additional problems: Hgb C trait     Discharge diagnosis: Preterm Pregnancy Delivered                                                                                                Post partum procedures:none  Augmentation: none  Complications: None  Hospital course:  Onset of Labor With Vaginal Delivery     21 y.o. yo G1P0101 at 5539w6d was admitted in Latent Labor on 12/13/2016. She was given a dose of betamethasone due to her preterm status. Patient had an uncomplicated labor course that progressed spontaneously as follows:  Membrane Rupture Time/Date: 2:31 AM ,12/14/2016   Intrapartum Procedures: Episiotomy: None [1]                                         Lacerations:  None [1]  Patient had a delivery of a Viable infant. 12/14/2016  Information for the patient's newborn:  Heather Vazquez, Girl Heather Vazquez [[id number]][[id number]]       Pateint had an uncomplicated postpartum course.  She is ambulating, tolerating a regular diet, passing flatus, and urinating well. Patient is discharged home in stable condition on 12/15/16.   Physical exam  Vitals:   12/14/16 0650 12/14/16 1030 12/14/16 1811 12/15/16 0559  BP: 127/64 (!) 114/54 127/70 (!) 115/58  Pulse: 93 84 90 85  Resp: 18 18 18 18   Temp: 98.9 F (37.2 C) 99.4 F (37.4 C) 98.8 F (37.1 C) 98.8 F (37.1 C)  TempSrc: Oral Oral Oral Oral  SpO2: 98% 99% 100%   Weight:      Height:       General: alert and cooperative Lochia: appropriate Uterine Fundus: firm Incision: N/A DVT Evaluation: No evidence of DVT seen on physical exam. Labs: Lab Results  Component Value Date   WBC 15.3 (H) 12/13/2016   HGB 12.4  12/13/2016   HCT 34.8 (L) 12/13/2016   MCV 81.5 12/13/2016   PLT 312 12/13/2016   CMP Latest Ref Rng & Units 05/12/2016  Glucose 65 - 99 mg/dL 401(U100(H)  BUN 6 - 20 mg/dL 16  Creatinine 2.720.44 - 5.361.00 mg/dL 6.440.61  Sodium 034135 - 742145 mmol/L 136  Potassium 3.5 - 5.1 mmol/L 3.8  Chloride 101 - 111 mmol/L 106  CO2 22 - 32 mmol/L 23  Calcium 8.9 - 10.3 mg/dL 59.510.2  Total Protein 6.5 - 8.1 g/dL 8.1  Total Bilirubin 0.3 - 1.2 mg/dL 0.7  Alkaline Phos 38 - 126 U/L 56  AST 15 - 41 U/L 17  ALT 14 - 54 U/L 15    Discharge instruction: per After Visit Summary and "Baby and Me Booklet".  After visit meds:  Allergies  as of 12/15/2016      Reactions   Peanut-containing Drug Products Shortness Of Breath, Swelling, Other (See Comments)   Reaction:  Tongue swelling      Medication List    STOP taking these medications   acetaminophen 500 MG tablet Commonly known as:  TYLENOL     TAKE these medications   ibuprofen 600 MG tablet Commonly known as:  ADVIL,MOTRIN Take 1 tablet (600 mg total) by mouth every 6 (six) hours as needed.   PRENATAL COMPLETE 14-0.4 MG Tabs Take 1 tablet by mouth daily.       Diet: routine diet  Activity: Advance as tolerated. Pelvic rest for 6 weeks.   Outpatient follow up:6 weeks Follow up Appt:No future appointments. Follow up Visit:No Follow-up on file.  Postpartum contraception: Depo Provera  Newborn Data: Live born female  Birth Weight: 5 lb 10.4 oz (2563 g) APGAR: 8, 9  Baby Feeding: Breast Disposition:home with mother   12/15/2016 Heather Vazquez, CNM  9:27 AM

## 2016-12-15 NOTE — Discharge Instructions (Signed)

## 2016-12-15 NOTE — Discharge Summary (Signed)
OB Discharge Summary                           Patient Name: Heather Vazquez DOB: 1996/09/26 MRN: [id number]  Date of admission: 12/13/2016 Delivering MD: Gorden Harms C   Date of discharge: 12/16/2016  Admitting diagnosis: 36.5wks bad cramps, ctx Intrauterine pregnancy: [redacted]w[redacted]d     Secondary diagnosis:  Active Problems:    Additional problems: Hgb C trait                                      Discharge diagnosis: Preterm Pregnancy Delivered                                                                                                Post partum procedures:none  Augmentation: none  Complications: None  Hospital course:  Onset of Labor With Vaginal Delivery     21 y.o. yo G1P0101 at [redacted]w[redacted]d was admitted in Latent Labor on 12/13/2016. She was given a dose of betamethasone due to her preterm status. Patient had an uncomplicated labor course that progressed spontaneously as follows:  Membrane Rupture Time/Date: 2:31 AM ,12/14/2016   Intrapartum Procedures: Episiotomy: None [1]                                         Lacerations:  None [1]  Patient had a delivery of a Viable infant. 12/14/2016  Information for the patient's newborn:  Krysti, Hickling Girl Welda [[id number]]       Pateint had an uncomplicated postpartum course.  She is ambulating, tolerating a regular diet, passing flatus, and urinating well. Patient is discharged home in stable condition on 12/16/16. (was originally scheduled for D/C on 2/1 but baby was kept)   Physical exam        Vitals:   12/14/16 0650 12/14/16 1030 12/14/16 1811 12/15/16 0559  BP: 127/64 (!) 114/54 127/70 (!) 115/58  Pulse: 93 84 90 85  Resp: 18 18 18 18   Temp: 98.9 F (37.2 C) 99.4 F (37.4 C) 98.8 F (37.1 C) 98.8 F (37.1 C)  TempSrc: Oral Oral Oral Oral  SpO2: 98% 99% 100%   Weight:      Height:       General: alert and cooperative Lochia: appropriate Uterine Fundus: firm Incision: N/A DVT Evaluation:  No evidence of DVT seen on physical exam. Labs: RecentLabs       Lab Results  Component Value Date   WBC 15.3 (H) 12/13/2016   HGB 12.4 12/13/2016   HCT 34.8 (L) 12/13/2016   MCV 81.5 12/13/2016   PLT 312 12/13/2016     CMP Latest Ref Rng & Units 05/12/2016  Glucose 65 - 99 mg/dL 782(N)  BUN 6 - 20 mg/dL 16  Creatinine 5.62 - 1.30 mg/dL 8.65  Sodium 784 - 696 mmol/L 136  Potassium 3.5 - 5.1 mmol/L 3.8  Chloride 101 - 111  mmol/L 106  CO2 22 - 32 mmol/L 23  Calcium 8.9 - 10.3 mg/dL 91.410.2  Total Protein 6.5 - 8.1 g/dL 8.1  Total Bilirubin 0.3 - 1.2 mg/dL 0.7  Alkaline Phos 38 - 126 U/L 56  AST 15 - 41 U/L 17  ALT 14 - 54 U/L 15    Discharge instruction: per After Visit Summary and "Baby and Me Booklet".  After visit meds:      Allergies as of 12/15/2016      Reactions   Peanut-containing Drug Products Shortness Of Breath, Swelling, Other (See Comments)   Reaction:  Tongue swelling         Medication List    STOP taking these medications   acetaminophen 500 MG tablet Commonly known as:  TYLENOL     TAKE these medications   ibuprofen 600 MG tablet Commonly known as:  ADVIL,MOTRIN Take 1 tablet (600 mg total) by mouth every 6 (six) hours as needed.   PRENATAL COMPLETE 14-0.4 MG Tabs Take 1 tablet by mouth daily.       Diet: routine diet  Activity: Advance as tolerated. Pelvic rest for 6 weeks.   Outpatient follow up:6 weeks Follow up Appt:No future appointments. Follow up Visit:No Follow-up on file.  Postpartum contraception: Depo Provera  Newborn Data: Live born female  Birth Weight: 5 lb 10.4 oz (2563 g) APGAR: 8, 9  Baby Feeding: Breast Disposition:home with mother

## 2016-12-16 NOTE — Lactation Note (Signed)
This note was copied from a baby's chart. Lactation Consultation Note  746w6d.  Baby 53 hours.  P1. Observed feeding for 5 min but per parents baby had already bf for 20 min per side. FOB did supplement w/ 10 ml of breastmilk and 10 ml of formula. Helped mother w/ pillows and postioning. Reviewed that mother should post pump 6 times a day and give baby back volume pumped with the difference in formula. Mom encouraged to feed baby 8-12 times/24 hours and with feeding cues at least q 3 hours. Reviewed engorgement care and monitoring voids/stools. Reviewed supplementation and feeding guidelines.    Patient Name: Heather Andreia Madilyn FiremanHayes WJXBJ'YToday's Date: 12/16/2016     Maternal Data    Feeding Feeding Type: Breast Fed Length of feed: 20 min  LATCH Score/Interventions                      Lactation Tools Discussed/Used     Consult Status      Heather Vazquez, Heather Vazquez 12/16/2016, 9:30 AM

## 2016-12-16 NOTE — Progress Notes (Signed)
Discharge education complete, discharge instructions and follow up appointment discussed. Patient verbalized understanding. 

## 2017-01-12 ENCOUNTER — Encounter: Payer: Self-pay | Admitting: Obstetrics and Gynecology

## 2017-01-12 ENCOUNTER — Ambulatory Visit (INDEPENDENT_AMBULATORY_CARE_PROVIDER_SITE_OTHER): Payer: Medicaid Other | Admitting: Obstetrics and Gynecology

## 2017-01-12 DIAGNOSIS — Z309 Encounter for contraceptive management, unspecified: Secondary | ICD-10-CM | POA: Insufficient documentation

## 2017-01-12 DIAGNOSIS — Z30013 Encounter for initial prescription of injectable contraceptive: Secondary | ICD-10-CM

## 2017-01-12 MED ORDER — MEDROXYPROGESTERONE ACETATE 150 MG/ML IM SUSP: 150.0000 mg | INTRAMUSCULAR | 3 refills | Status: AC

## 2017-01-12 NOTE — Progress Notes (Deleted)
Subjective:     Heather Vazquez is a 21 y.o. female who presents for a postpartum visit. She is {1-10:13787} {time; units:18646} postpartum following a {delivery:12449}. I have fully reviewed the prenatal and intrapartum course. The delivery was at *** gestational weeks. Outcome: {delivery outcome:32078}. Anesthesia: {anesthesia types:812}. Postpartum course has been ***. Baby's course has been ***. Baby is feeding by {breast/bottle:69}. Bleeding {vag bleed:12292}. Bowel function is {normal:32111}. Bladder function is {normal:32111}. Patient {is/is not:9024} sexually active. Contraception method is {contraceptive method:5051}. Postpartum depression screening: negative.  {Common ambulatory SmartLinks:19316}  Review of Systems {ros; complete:30496}   Objective:    There were no vitals taken for this visit.  General:  {gen appearance:16600}   Breasts:  {breast exam:1202::"inspection negative, no nipple discharge or bleeding, no masses or nodularity palpable"}  Lungs: {lung exam:16931}  Heart:  {heart exam:5510}  Abdomen: {abdomen exam:16834}   Vulva:  {labia exam:12198}  Vagina: {vagina exam:12200}  Cervix:  {cervix exam:14595}  Corpus: {uterus exam:12215}  Adnexa:  {adnexa exam:12223}  Rectal Exam: {rectal/vaginal exam:12274}        Assessment:    *** postpartum exam. Pap smear {done:10129} at today's visit.   Plan:    1. Contraception: {method:5051} 2. *** 3. Follow up in: {1-10:13787} {time; units:19136} or as needed.

## 2017-01-12 NOTE — Progress Notes (Signed)
Subjective:     Heather Vazquez is a 21 y.o. female who presents for a postpartum visit. She is 4 weeks postpartum following a spontaneous vaginal delivery. I have fully reviewed the prenatal and intrapartum course. The delivery was at 36 6/7 gestational weeks. Outcome: spontaneous vaginal delivery. Anesthesia: epidural. Postpartum course has been unremarkable. Baby's course has been unremarkable. Baby is feeding by breast. Bleeding no bleeding. Bowel function is normal. Bladder function is normal. Patient is not sexually active. Contraception method is none. Postpartum depression screening: negative.  The following portions of the patient's history were reviewed and updated as appropriate: allergies, current medications, past family history, past medical history, past social history and past surgical history.  Review of Systems Pertinent items are noted in HPI.   Objective:    There were no vitals taken for this visit.  General:  alert   Breasts:  not evaluated  Lungs: clear to auscultation bilaterally  Heart:  regular rate and rhythm, S1, S2 normal, no murmur, click, rub or gallop  Abdomen: soft, non-tender; bowel sounds normal; no masses,  no organomegaly   Vulva:  not evaluated  Vagina: not evaluated  Cervix:  not evaluated  Corpus: not examined  Adnexa:  not evaluated  Rectal Exam: Not performed.        Assessment:     Normal postpartum exam.  Plan:    1. Contraception: Depo-Provera injections 2. Return to normal activities 3. Follow up in: 1 year or as needed.

## 2017-01-16 ENCOUNTER — Ambulatory Visit (INDEPENDENT_AMBULATORY_CARE_PROVIDER_SITE_OTHER): Payer: Medicaid Other

## 2017-01-16 VITALS — BP 113/77 | HR 77 | Wt 143.9 lb

## 2017-01-16 DIAGNOSIS — Z30013 Encounter for initial prescription of injectable contraceptive: Secondary | ICD-10-CM | POA: Diagnosis not present

## 2017-01-16 DIAGNOSIS — Z3202 Encounter for pregnancy test, result negative: Secondary | ICD-10-CM

## 2017-01-16 DIAGNOSIS — Z3042 Encounter for surveillance of injectable contraceptive: Secondary | ICD-10-CM | POA: Diagnosis not present

## 2017-01-16 LAB — POCT URINE PREGNANCY: Preg Test, Ur: NEGATIVE

## 2017-01-16 MED ORDER — MEDROXYPROGESTERONE ACETATE 150 MG/ML IM SUSP
150.0000 mg | Freq: Once | INTRAMUSCULAR | Status: AC
  Administered 2017-01-16: 150 mg via INTRAMUSCULAR

## 2017-01-16 NOTE — Progress Notes (Signed)
Patient presents for DEPO Injection. Shot given in RUOQ. Administrations This Visit    medroxyPROGESTERone (DEPO-PROVERA) injection 150 mg    Admin Date 01/16/2017 Action Given Dose 150 mg Route Intramuscular Administered By Maretta Beesarol J McGlashan, RMA

## 2017-03-18 IMAGING — US US MFM OB COMP +14 WKS
1 series · 14 of 28 positions shown · non-contrast
Comparison: none

[Series 1: us mfm ob comp +14 wks · 65 acquisitions, 14 frames shown]
[im 3/65]
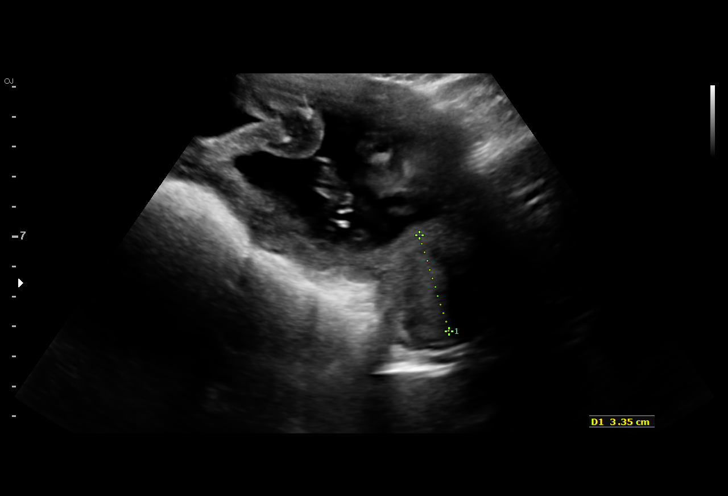
[im 8/65]
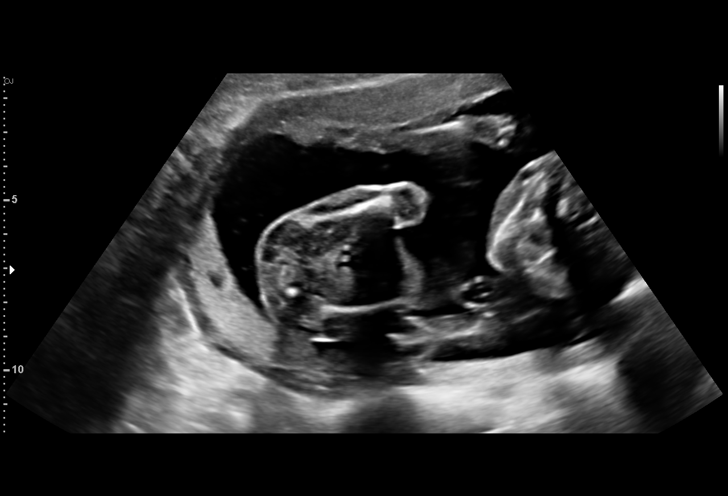
[im 12/65]
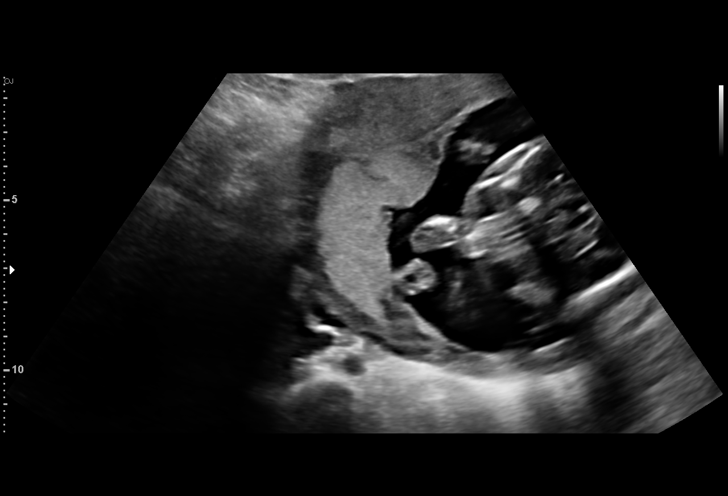
[im 17/65]
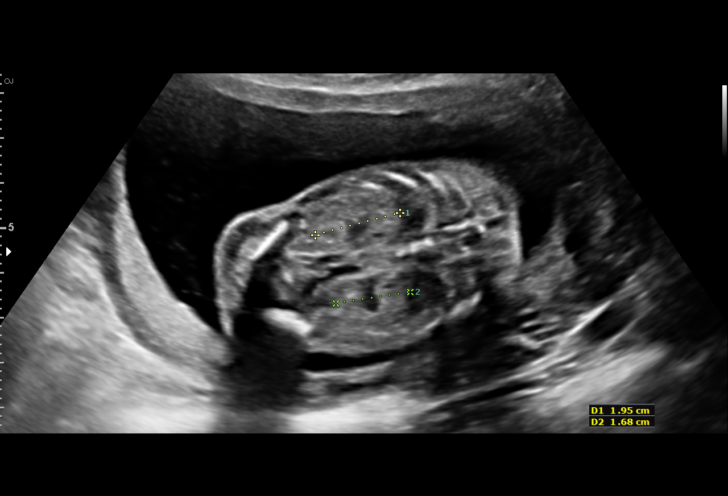
[im 22/65]
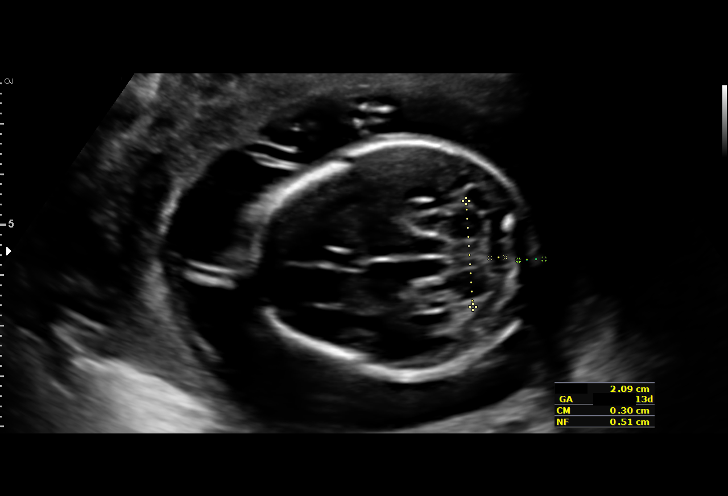
[im 27/65]
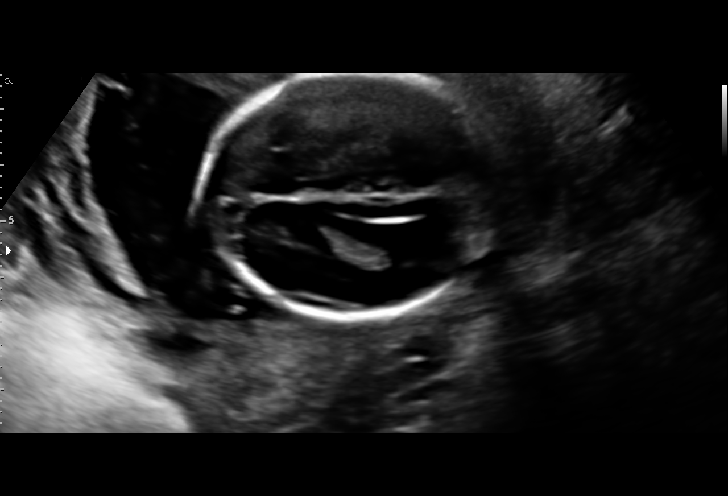
[im 31/65]
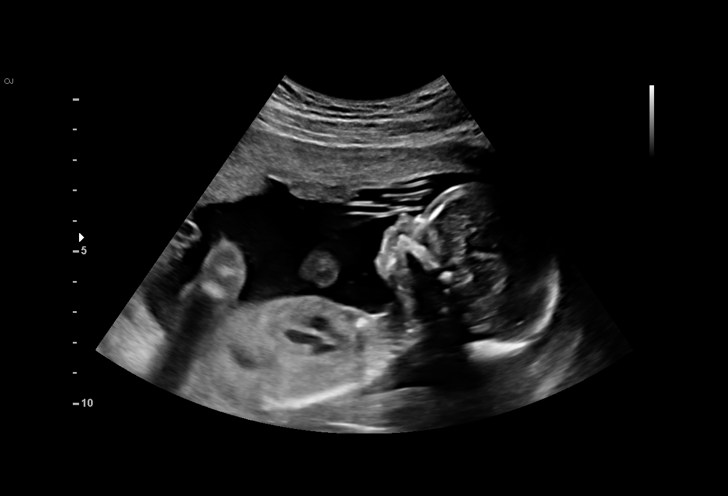
[im 36/65]
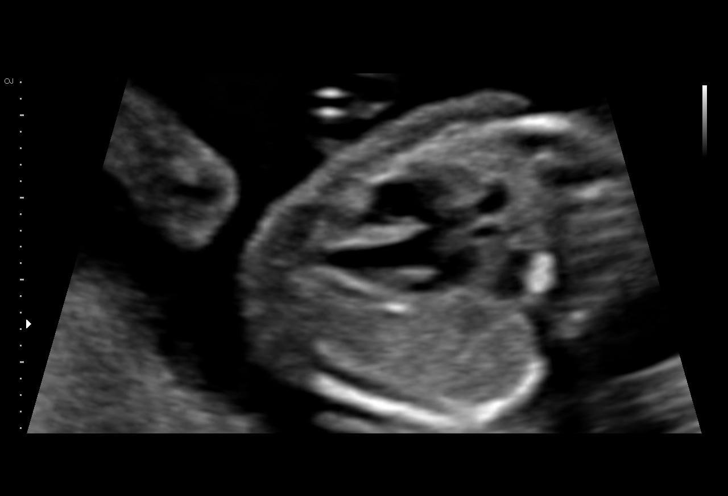
[im 41/65]
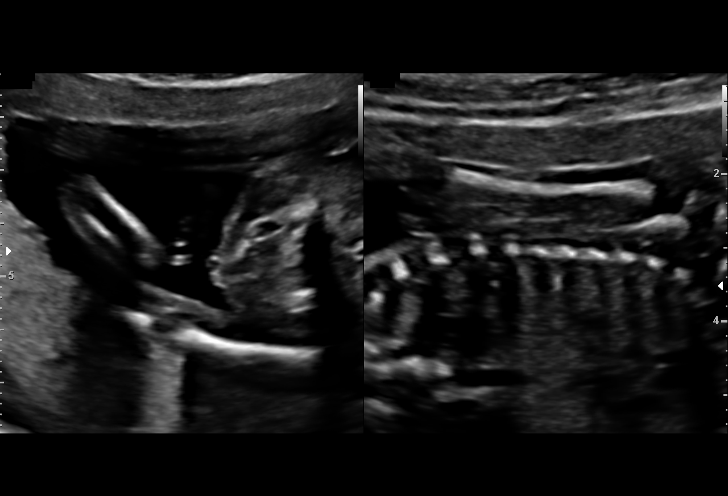
[im 46/65]
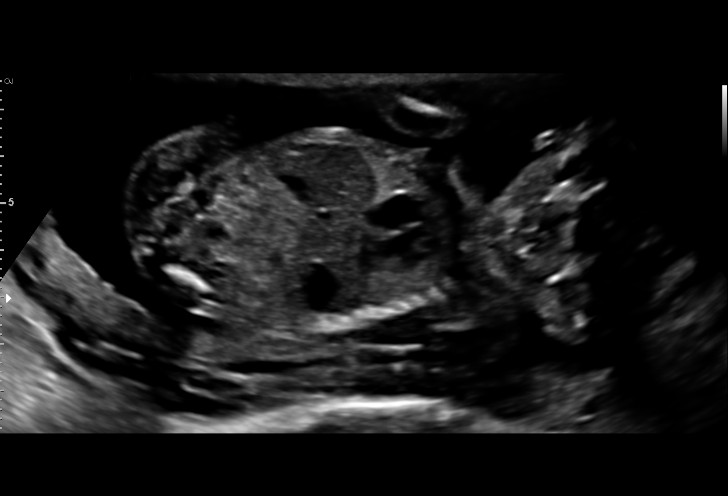
[im 50/65]
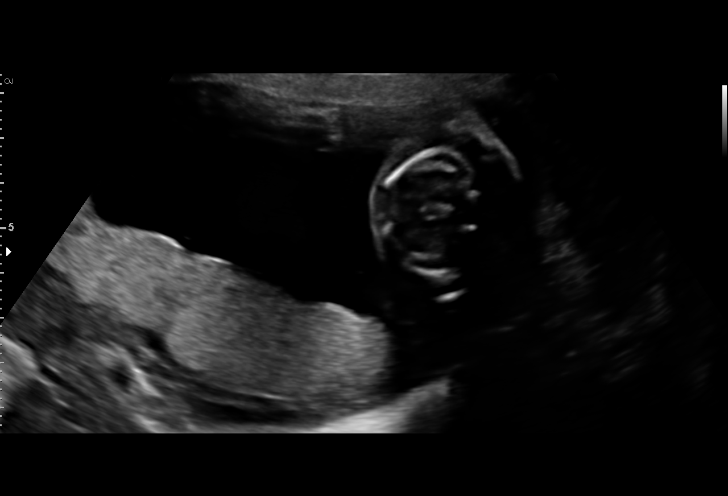
[im 55/65]
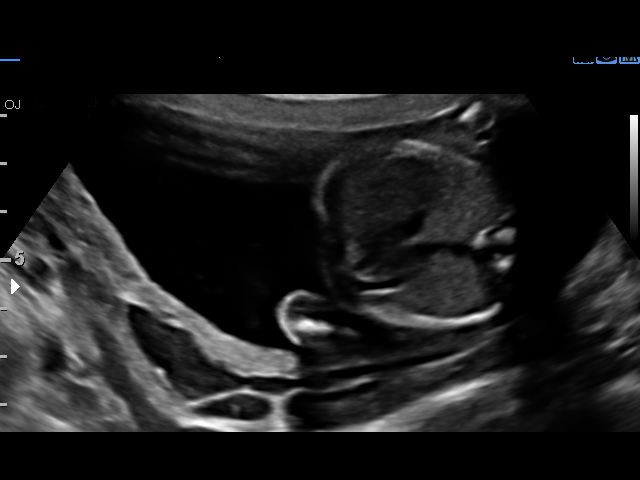
[im 60/65]
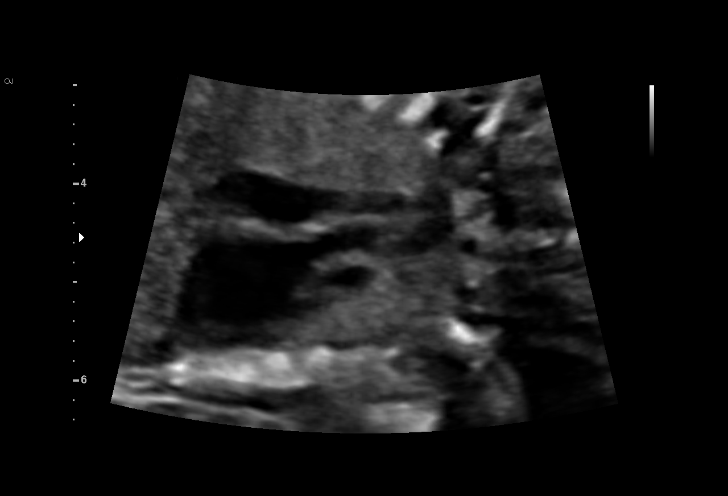
[im 65/65]
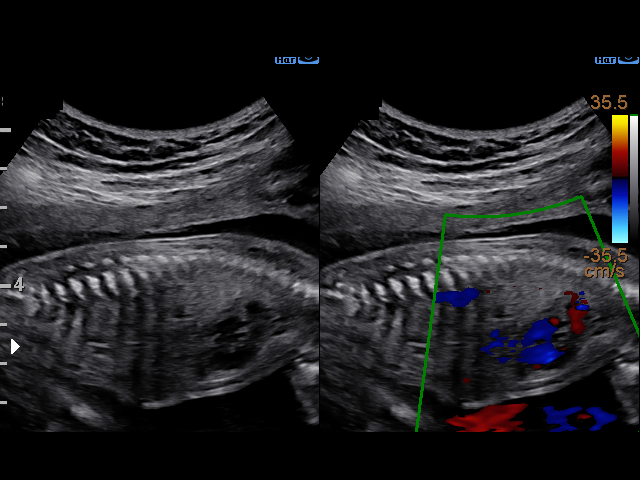

[14 of 28 positions shown; findings below may reference images not displayed]

Road [HOSPITAL]

1  HEINZ SKEETE              [id number]      [id number]     [id number]
Indications

19 weeks gestation of pregnancy
Basic anatomic survey                          Z36
OB History

Gravidity:    1         Term:   0        Prem:   0         SAB:   0
TOP:          0       Ectopic:  0        Living: 0
Fetal Evaluation

Num Of Fetuses:     1
Cardiac Activity:   Observed
Presentation:       Cephalic
Placenta:           Fundal, above cervical os

Amniotic Fluid
AFI FV:      Subjectively within normal limits

Largest Pocket(cm)
4.2
Biometry

BPD:      45.3  mm     G. Age:  19w 5d         79  %    CI:          78.6  %    70 - 86
FL/HC:       18.1  %    16.1 -
HC:      161.6  mm     G. Age:  19w 0d         41  %    HC/AC:       1.07       1.09 -
AC:      151.5  mm     G. Age:  20w 2d         85  %    FL/BPD:      64.5  %
FL:       29.2  mm     G. Age:  19w 0d         43  %    FL/AC:       19.3  %    20 - 24
HUM:      29.3  mm     G. Age:  19w 4d         65  %
CER:      20.9  mm     G. Age:  19w 6d         70  %
NFT:       5.1  mm
CM:          3  mm
Est. FW:     305   gm    0 lb 11 oz     55  %
Gestational Age

LMP:           23w 5d        Date:  02/27/16                 EDD:    12/03/16
U/S Today:     19w 4d                                        EDD:    01/01/17
Best:          19w 0d     Det. By:  Early Ultrasound         EDD:    01/05/17
(05/13/16)
Anatomy

Cranium:               Appears normal         Aortic Arch:            Appears normal
Cavum:                 Appears normal         Ductal Arch:            Appears normal
Ventricles:            Appears normal         Diaphragm:              Appears normal
Choroid Plexus:        Appears normal         Stomach:                Appears normal, left
sided
Cerebellum:            Appears normal         Abdomen:                Appears normal
Posterior Fossa:       Appears normal         Abdominal Wall:         Appears nml (cord
insert, abd wall)
Nuchal Fold:           Appears normal         Cord Vessels:           Appears normal (3
vessel cord)
Face:                  Appears normal         Kidneys:                Appear normal
(orbits and profile)
Lips:                  Appears normal         Bladder:                Appears normal
Thoracic:              Appears normal         Spine:                  Appears normal
Heart:                 Appears normal         Upper Extremities:      Appears normal
(4CH, axis, and situs
RVOT:                  Appears normal         Lower Extremities:      Appears normal
LVOT:                  Appears normal

Other:  Fetus appears to be a female. Heels and rt 5th digit visualized.
Cervix Uterus Adnexa

Cervix
Length:            3.4  cm.
Normal appearance by transabdominal scan.

Uterus
No abnormality visualized.

Left Ovary
Not visualized. No adnexal mass visualized.

Right Ovary
Not visualized. No adnexal mass visualized.
Impression

SIUP at 19+0 weeks
Normal detailed fetal anatomy
Markers of aneuploidy: none
Normal amniotic fluid volume
Measurements consistent with early US
Recommendations

Follow-up as clinically indicated

## 2017-04-11 ENCOUNTER — Ambulatory Visit: Payer: Medicaid Other

## 2017-11-28 IMAGING — US US OB COMP LESS 14 WK
1 series · 14 of 28 positions shown · non-contrast
Comparison: None.

CLINICAL DATA: Gradually worsening lower abdominal pain and nausea
for 1 week

EXAM:
OBSTETRIC <14 WK US AND TRANSVAGINAL OB US
TECHNIQUE: Both transabdominal and transvaginal ultrasound examinations were
performed for complete evaluation of the gestation as well as the
maternal uterus, adnexal regions, and pelvic cul-de-sac.
Transvaginal technique was performed to assess early pregnancy.

[Series 1: us ob comp less 14 wk · 77 acquisitions, 14 frames shown]
[im 3/77]
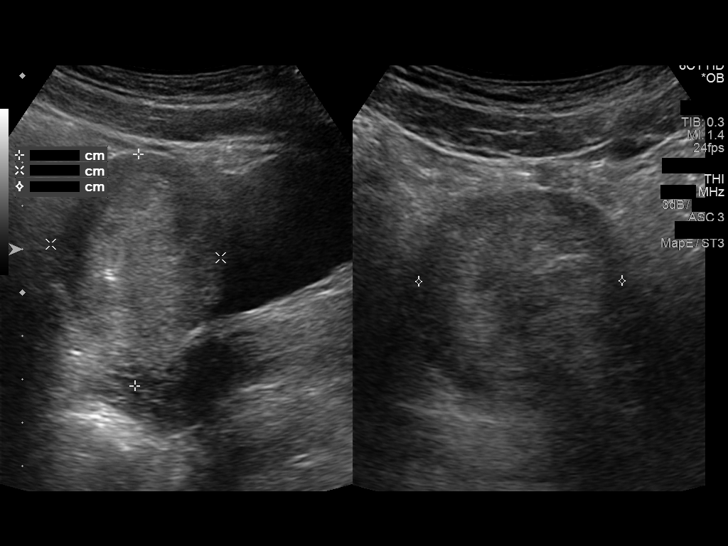
[im 9/77]
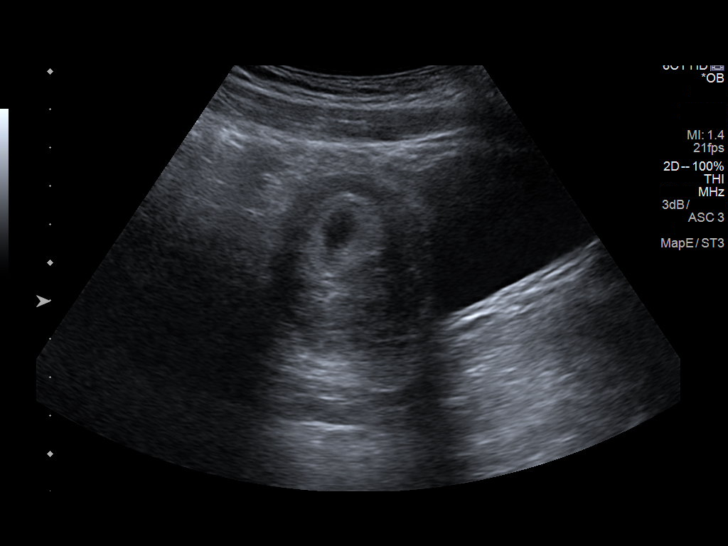
[im 15/77]
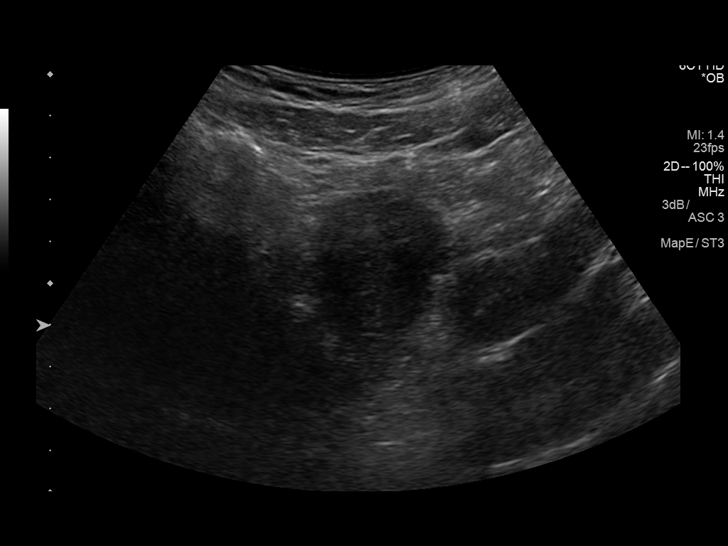
[im 20/77]
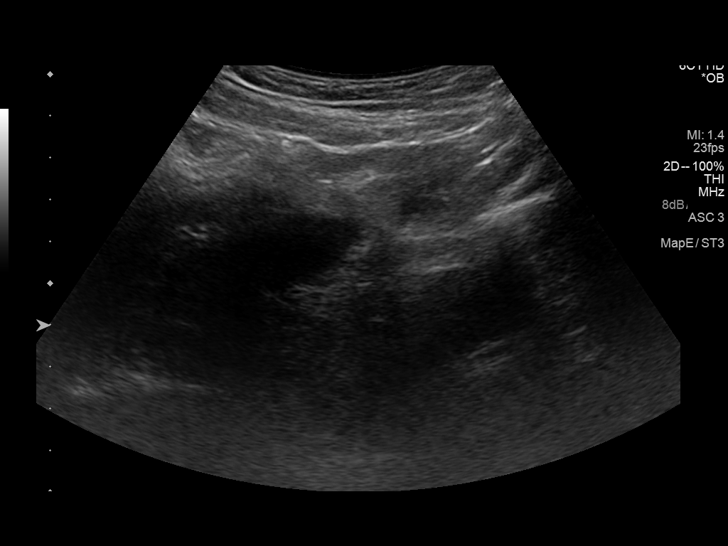
[im 26/77]
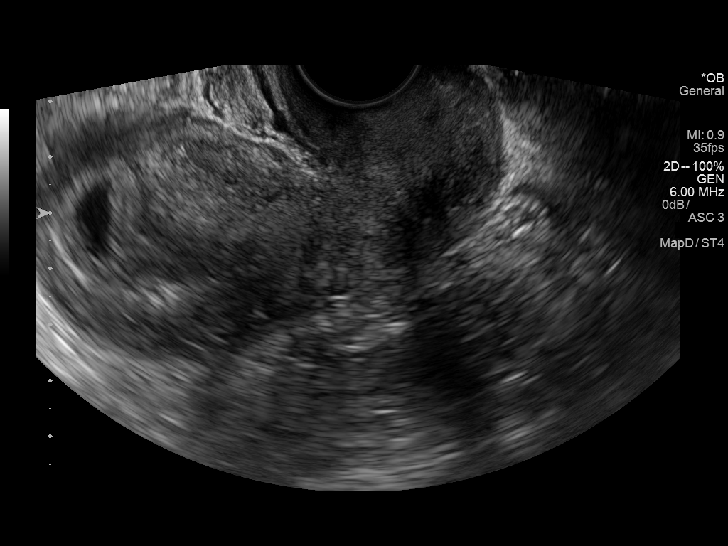
[im 31/77]
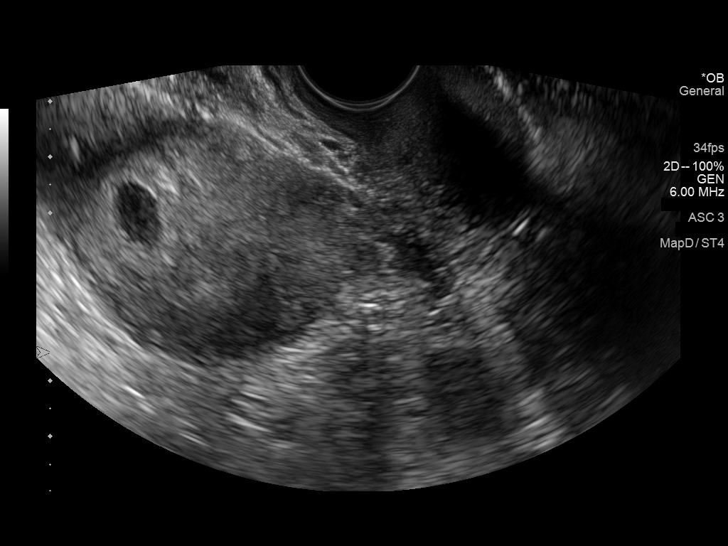
[im 37/77]
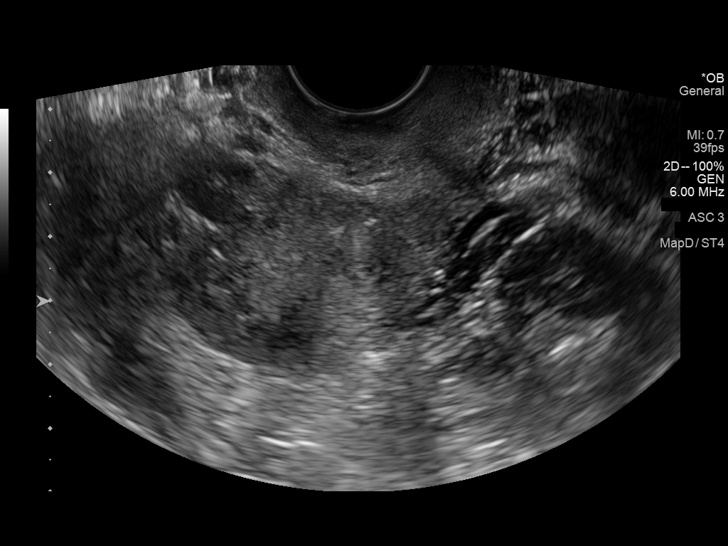
[im 43/77]
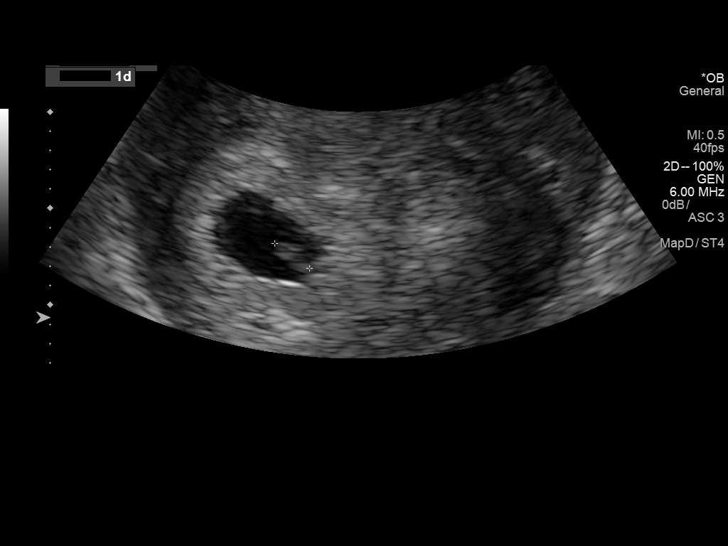
[im 48/77]
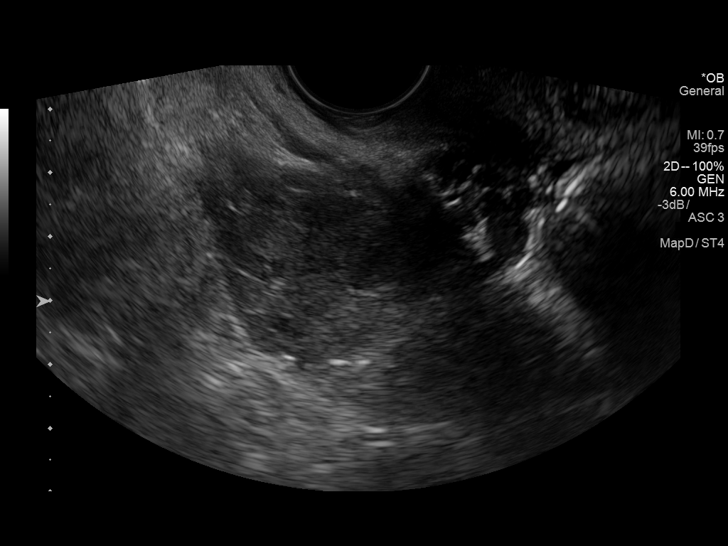
[im 54/77]
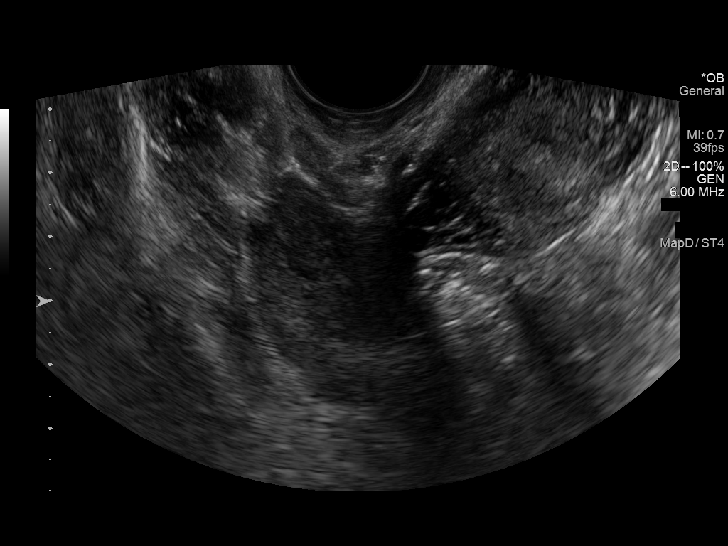
[im 60/77]
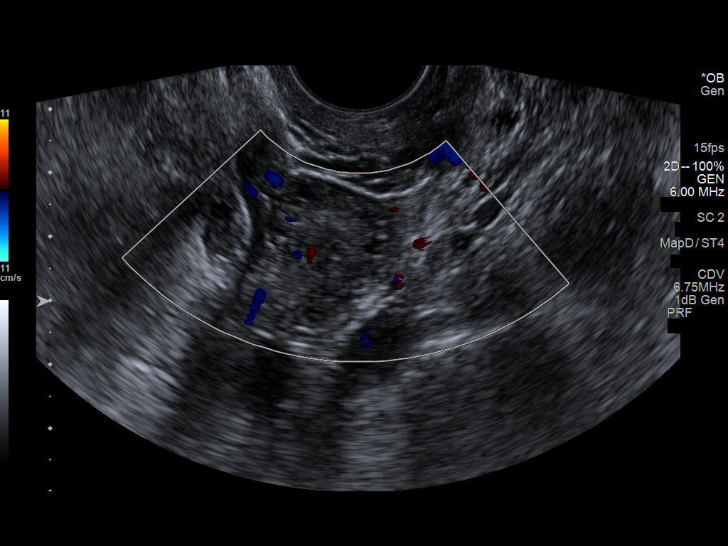
[im 65/77]
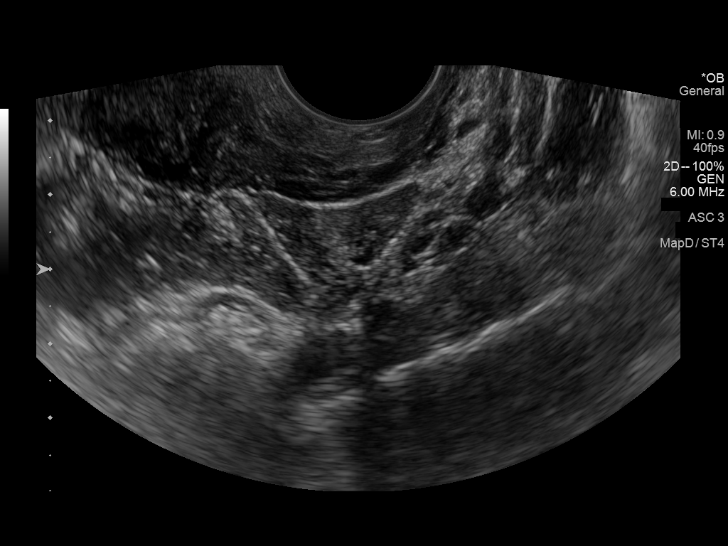
[im 71/77]
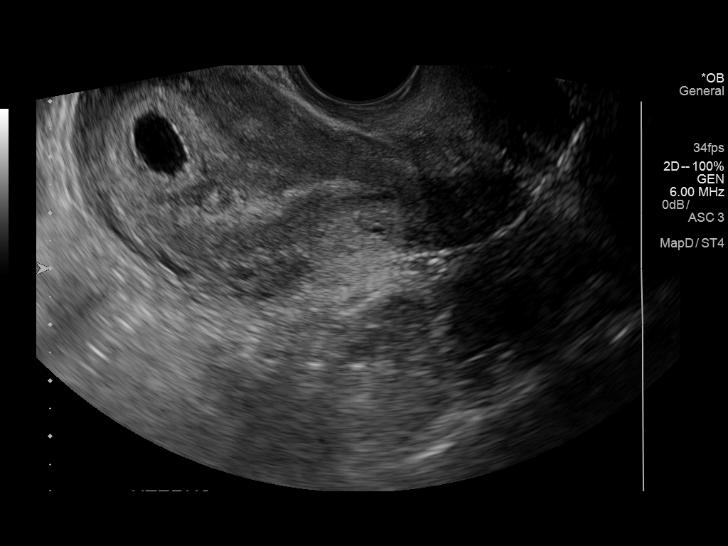
[im 77/77]
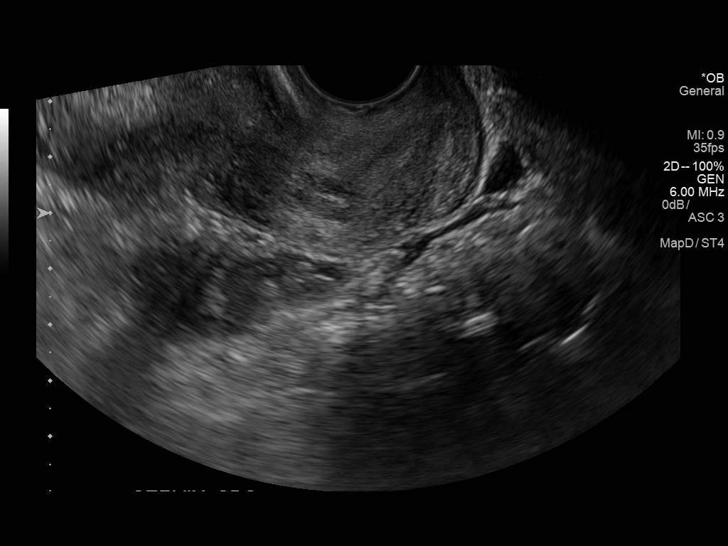

[14 of 28 positions shown; findings below may reference images not displayed]

FINDINGS: Intrauterine gestational sac: Single

Yolk sac:

Embryo:  Yes

Cardiac Activity: Yes

Heart Rate: 125  bpm

MSD:   mm    w     d

CRL:  5  mm   6 w   1 d                  US EDC: 01/05/2017

Subchorionic hemorrhage:  None visualized.

Maternal uterus/adnexae: Normal ovaries. No abnormal fluid
collections.
IMPRESSION: Single living intrauterine gestation measuring 6 weeks 1 day by
crown-rump length.

## 2024-08-27 ENCOUNTER — Other Ambulatory Visit: Payer: Self-pay

## 2024-08-27 ENCOUNTER — Emergency Department (HOSPITAL_BASED_OUTPATIENT_CLINIC_OR_DEPARTMENT_OTHER)
Admission: EM | Admit: 2024-08-27 | Discharge: 2024-08-28 | Disposition: A | Discharge status: Home / Self Care | Attending: Emergency Medicine | Admitting: Emergency Medicine

## 2024-08-27 ENCOUNTER — Emergency Department (HOSPITAL_BASED_OUTPATIENT_CLINIC_OR_DEPARTMENT_OTHER)

## 2024-08-27 DIAGNOSIS — R1031 Right lower quadrant pain: Secondary | ICD-10-CM | POA: Diagnosis present

## 2024-08-27 LAB — CBC WITH DIFFERENTIAL/PLATELET
Abs Immature Granulocytes: 0.04 K/uL (ref 0.00–0.07)
Basophils Absolute: 0.1 K/uL (ref 0.0–0.1)
Basophils Relative: 1 %
Eosinophils Absolute: 0.1 K/uL (ref 0.0–0.5)
Eosinophils Relative: 1 %
HCT: 37.6 % (ref 36.0–46.0)
Hemoglobin: 13.5 g/dL (ref 12.0–15.0)
Immature Granulocytes: 0 %
Lymphocytes Relative: 25 %
Lymphs Abs: 3.1 K/uL (ref 0.7–4.0)
MCH: 28.2 pg (ref 26.0–34.0)
MCHC: 35.9 g/dL (ref 30.0–36.0)
MCV: 78.5 fL — ABNORMAL LOW (ref 80.0–100.0)
Monocytes Absolute: 0.9 K/uL (ref 0.1–1.0)
Monocytes Relative: 7 %
Neutro Abs: 8.3 K/uL — ABNORMAL HIGH (ref 1.7–7.7)
Neutrophils Relative %: 66 %
Platelets: 428 K/uL — ABNORMAL HIGH (ref 150–400)
RBC: 4.79 MIL/uL (ref 3.87–5.11)
RDW: 12.7 % (ref 11.5–15.5)
WBC: 12.4 K/uL — ABNORMAL HIGH (ref 4.0–10.5)
nRBC: 0 % (ref 0.0–0.2)

## 2024-08-27 LAB — URINALYSIS, ROUTINE W REFLEX MICROSCOPIC
Bilirubin Urine: NEGATIVE
Glucose, UA: NEGATIVE mg/dL
Hgb urine dipstick: NEGATIVE
Ketones, ur: NEGATIVE mg/dL
Leukocytes,Ua: NEGATIVE
Nitrite: NEGATIVE
Protein, ur: NEGATIVE mg/dL
Specific Gravity, Urine: 1.027 (ref 1.005–1.030)
pH: 5.5 (ref 5.0–8.0)

## 2024-08-27 LAB — WET PREP, GENITAL
Clue Cells Wet Prep HPF POC: NONE SEEN
Sperm: NONE SEEN
Trich, Wet Prep: NONE SEEN
WBC, Wet Prep HPF POC: <10 (ref <10)
Yeast Wet Prep HPF POC: NONE SEEN

## 2024-08-27 LAB — COMPREHENSIVE METABOLIC PANEL WITH GFR
ALT: 16 U/L (ref 0–44)
AST: 19 U/L (ref 15–41)
Albumin: 4.5 g/dL (ref 3.5–5.0)
Alkaline Phosphatase: 83 U/L (ref 38–126)
Anion gap: 11 (ref 5–15)
BUN: 18 mg/dL (ref 6–20)
CO2: 23 mmol/L (ref 22–32)
Calcium: 10.1 mg/dL (ref 8.9–10.3)
Chloride: 103 mmol/L (ref 98–111)
Creatinine, Ser: 0.94 mg/dL (ref 0.44–1.00)
GFR, Estimated: >60 mL/min (ref >60)
Glucose, Bld: 101 mg/dL — ABNORMAL HIGH (ref 70–99)
Potassium: 4.1 mmol/L (ref 3.5–5.1)
Sodium: 137 mmol/L (ref 135–145)
Total Bilirubin: 0.2 mg/dL (ref 0.0–1.2)
Total Protein: 7.6 g/dL (ref 6.5–8.1)

## 2024-08-27 LAB — PREGNANCY, URINE: Preg Test, Ur: NEGATIVE

## 2024-08-27 NOTE — ED Notes (Signed)
Pt declining pain meds at this time

## 2024-08-27 NOTE — ED Provider Notes (Signed)
 [place] EMERGENCY DEPARTMENT AT [facility] Provider Note   CSN: [id number] Arrival date & time: 08/27/24  2017     Patient presents with: Cyst   Heather Vazquez is a 28 y.o. female who presents with lower right abdominal pain.  Patient relates that this pain started approximately 1 week ago without any associated traumatic or correlating event.  Patient states that the pain is wax/wane and sharp in nature.  Patient states that she has tried over-the-counter pain management without relief.  Patient states that positional changes sometimes provide relief.  Patient states that intercourse makes the pain worse.  Patient states that she just finished a course of antibiotics for a UTI however does not believe this is associated.  Patient denies any abnormal vaginal discharge or bleeding.  Patient states that last menstrual cycle was last week.  Patient states that she still has her appendix. Patient denies use of birth control however denies chance that she could be pregnant.  Patient relays a history of ovarian cyst and was concerned that she could currently have one that has ruptured.    HPI     Prior to Admission medications   Medication Sig Start Date End Date Taking? Authorizing Provider  ibuprofen  (ADVIL ,MOTRIN ) 600 MG tablet Take 1 tablet (600 mg total) by mouth every 6 (six) hours as needed. 12/15/16   Loreli Suzen BIRCH, CNM  medroxyPROGESTERone  (DEPO-PROVERA ) 150 MG/ML injection Inject 1 mL (150 mg total) into the muscle every 3 (three) months. 01/12/17   Ervin, Michael L, MD  Prenatal Vit-Fe Fumarate-FA (PRENATAL COMPLETE) 14-0.4 MG TABS Take 1 tablet by mouth daily. 06/24/16   Hedges, Reyes, PA-C    Allergies: Peanut-containing drug products    Review of Systems  Constitutional:  Negative for fever.  Gastrointestinal:  Positive for abdominal pain. Negative for abdominal distention, blood in stool, constipation, diarrhea, nausea and vomiting.       Right-sided lower abdominal  pain.  Genitourinary:  Negative for difficulty urinating, dyspareunia, dysuria, flank pain, frequency, genital sores, menstrual problem, pelvic pain, vaginal bleeding, vaginal discharge and vaginal pain.  Skin:  Negative for rash.    Updated Vital Signs BP 135/78 (BP Location: Right Arm)   Pulse 72   Temp 98.4 F (36.9 C) (Oral)   Resp 18   Ht 5' 2 (1.575 m)   Wt 95.7 kg   LMP 08/19/2024 (Exact Date)   SpO2 99%   BMI 38.59 kg/m   Physical Exam Exam conducted with a chaperone present (RN present for pelvic exam.).  Constitutional:      General: She is in acute distress.     Appearance: Normal appearance. She is not ill-appearing.  Abdominal:     General: Bowel sounds are normal. There is no distension.     Palpations: Abdomen is soft.     Tenderness: There is abdominal tenderness in the right lower quadrant. There is no right CVA tenderness or guarding. Negative signs include Murphy's sign and McBurney's sign.     Comments: Patient reports right lower quadrant pain upon palpation.  No erythema or ecchymosis noted to abdomen.  Genitourinary:    General: Normal vulva.     Labia:        Right: No tenderness, lesion or injury.        Left: No tenderness, lesion or injury.      Vagina: Vaginal discharge present. No erythema, tenderness, bleeding or lesions.     Cervix: Discharge present. No cervical bleeding.  Comments: Vaginal exam was unremarkable.  Patient denied any pain during exam.  There was increased amount of white discharge during exam.  There was no erythema or bleeding noted. Skin:    General: Skin is warm and dry.     Capillary Refill: Capillary refill takes less than 2 seconds.  Neurological:     Mental Status: She is alert.  Psychiatric:        Behavior: Behavior is cooperative.     (all labs ordered are listed, but only abnormal results are displayed) Labs Reviewed  COMPREHENSIVE METABOLIC PANEL WITH GFR - Abnormal; Notable for the following components:       Result Value   Glucose, Bld 101 (*)    All other components within normal limits  CBC WITH DIFFERENTIAL/PLATELET - Abnormal; Notable for the following components:   WBC 12.4 (*)    MCV 78.5 (*)    Platelets 428 (*)    Neutro Abs 8.3 (*)    All other components within normal limits  WET PREP, GENITAL  URINALYSIS, ROUTINE W REFLEX MICROSCOPIC  PREGNANCY, URINE  RPR  HIV ANTIBODY (ROUTINE TESTING W REFLEX)  GC/CHLAMYDIA PROBE AMP ([place]) NOT AT [facility]    EKG: None  Radiology: US  PELVIC COMPLETE W TRANSVAGINAL AND TORSION R/O Result Date: 08/27/2024 EXAM: US  Pelvis, Complete Transvaginal and Transabdominal with Doppler 08/27/2024 10:16:37 PM TECHNIQUE: Transabdominal and transvaginal pelvic duplex ultrasound using B-mode/gray scaled imaging with Doppler spectral analysis and color flow was obtained. COMPARISON: None available CLINICAL HISTORY: Right lower quadrant pain for 1 day. Last menstrual 08/19/2024. FINDINGS: UTERUS: Uterus measures 8.1 x 4 x 4.1 cm, volume 68.5 ml. Uterus demonstrates normal myometrial echotexture. ENDOMETRIAL STRIPE: Endometrial measures 9 mm. Endometrial stripe is within normal limits. RIGHT OVARY: Right ovary measures 2.6 x 1.6 x 2.1 cm, volume 4.5 ml. Right ovary is within normal limits. There is normal arterial and venous Doppler flow. LEFT OVARY: Left ovary measures 3.6 x 1.9 x 2.6 cm, volume 9.1 ml. Left ovary is within normal limits. There is normal arterial and venous Doppler flow. FREE FLUID: No free fluid. IMPRESSION: 1. No acute pelvic abnormality detected. Electronically signed by: Morgane Naveau MD 08/27/2024 10:20 PM EDT RP Workstation: [id number]     Procedures   Medications Ordered in the ED  iohexol (OMNIPAQUE) 300 MG/ML solution 100 mL (has no administration in time range)                                    Medical Decision Making Amount and/or Complexity of Data Reviewed Labs: ordered. Radiology: ordered.     Patient  presents to the ED for concern of right lower abdominal pain, this involves an extensive number of treatment options, and is a complaint that carries with it a high risk of complications and morbidity.  The differential diagnosis includes ovarian torsion, ovarian cyst, PID, STI, appendicitis.   Co morbidities that complicate the patient evaluation  Obesity.   Additional history obtained:  Additional history obtained from Outside Medical Records   External records from outside source obtained and reviewed including past medical history, allergies, medications.   Lab Tests:  I Ordered, and personally interpreted labs.  The pertinent results include: WBC 12.4, platelets 428, neutrophils 8.3.  Imaging Studies ordered:  I ordered imaging studies including transvaginal pelvic ultrasound, CT abdomen pelvis with contrast. I independently visualized and interpreted imaging which showed transvaginal pelvic ultrasound showed no  acute pelvic abnormality. I agree with the radiologist interpretation   Cardiac Monitoring:  N/A   Medicines ordered and prescription drug management:  N/A   Test Considered:  N/A   Critical Interventions:  N/A   Consultations Obtained:  N/A   Problem List / ED Course:  N/A   Reevaluation:  N/A   Social Determinants of Health:  N/A   Dispostion:  After consideration of the diagnostic results and the patients response to treatment, I feel that the patent would benefit from supportive care at home.  Patient care was transferred over to Dr. Bari, if pending CT comes back negative anticipate patient can be sent home with supportive care measures.     Final diagnoses:  None    ED Discharge Orders     None          Willma Duwaine CROME, [place] 08/28/24 FONTAINE Bari Charmaine JULIANNA, MD 08/30/24 [phone]

## 2024-08-27 NOTE — ED Provider Notes (Incomplete)
 [place] EMERGENCY DEPARTMENT AT [facility] Provider Note   CSN: [id number] Arrival date & time: 08/27/24  2017     Patient presents with: Cyst   Heather Vazquez is a 28 y.o. female who presents with lower right abdominal pain.  Patient relates that this pain started approximately 1 week ago without any associated traumatic or correlating event.  Patient states that the pain is wax/wane and sharp in nature.  Patient states that she has tried over-the-counter pain management without relief.  Patient states that positional changes sometimes provide relief.  Patient states that intercourse makes the pain worse.  Patient states that she just finished a course of antibiotics for a UTI however does not believe this is associated.  Patient denies any abnormal vaginal discharge or bleeding.  Patient states that she still has her appendix.  Patient states that last menstrual cycle was last week.  Patient denies use of birth control however denies chance that she could be pregnant.  Patient relays a history of ovarian cyst and was concerned that she could currently have one that has ruptured.   {Add pertinent medical, surgical, social history, OB history to HPI:32947} HPI     Prior to Admission medications   Medication Sig Start Date End Date Taking? Authorizing Provider  ibuprofen  (ADVIL ,MOTRIN ) 600 MG tablet Take 1 tablet (600 mg total) by mouth every 6 (six) hours as needed. 12/15/16   Loreli Suzen BIRCH, CNM  medroxyPROGESTERone  (DEPO-PROVERA ) 150 MG/ML injection Inject 1 mL (150 mg total) into the muscle every 3 (three) months. 01/12/17   Ervin, Michael L, MD  Prenatal Vit-Fe Fumarate-FA (PRENATAL COMPLETE) 14-0.4 MG TABS Take 1 tablet by mouth daily. 06/24/16   Hedges, Reyes, PA-C    Allergies: Peanut-containing drug products    Review of Systems  Constitutional:  Negative for fever.  Gastrointestinal:  Positive for abdominal pain. Negative for abdominal distention, blood in stool,  constipation, diarrhea, nausea and vomiting.       Right-sided lower abdominal pain.  Genitourinary:  Negative for difficulty urinating, dyspareunia, dysuria, flank pain, frequency, genital sores, menstrual problem, pelvic pain, vaginal bleeding, vaginal discharge and vaginal pain.  Skin:  Negative for rash.    Updated Vital Signs BP 135/78 (BP Location: Right Arm)   Pulse 72   Temp 98.4 F (36.9 C) (Oral)   Resp 18   Ht 5' 2 (1.575 m)   Wt 95.7 kg   LMP 08/19/2024 (Exact Date)   SpO2 99%   BMI 38.59 kg/m   Physical Exam Exam conducted with a chaperone present (RN present for pelvic exam.).  Constitutional:      General: She is in acute distress.     Appearance: Normal appearance. She is not ill-appearing.  Abdominal:     General: Bowel sounds are normal. There is no distension.     Palpations: Abdomen is soft.     Tenderness: There is abdominal tenderness in the right lower quadrant. There is no right CVA tenderness or guarding. Negative signs include Murphy's sign and McBurney's sign.     Comments: Patient reports right lower quadrant pain upon palpation.  No erythema or ecchymosis noted to abdomen.  Genitourinary:    General: Normal vulva.     Labia:        Right: No tenderness.        Left: No tenderness.   Neurological:     Mental Status: She is alert.  Psychiatric:        Behavior: Behavior is cooperative.     (  all labs ordered are listed, but only abnormal results are displayed) Labs Reviewed  COMPREHENSIVE METABOLIC PANEL WITH GFR - Abnormal; Notable for the following components:      Result Value   Glucose, Bld 101 (*)    All other components within normal limits  CBC WITH DIFFERENTIAL/PLATELET - Abnormal; Notable for the following components:   WBC 12.4 (*)    MCV 78.5 (*)    Platelets 428 (*)    Neutro Abs 8.3 (*)    All other components within normal limits  WET PREP, GENITAL  URINALYSIS, ROUTINE W REFLEX MICROSCOPIC  PREGNANCY, URINE  RPR  HIV  ANTIBODY (ROUTINE TESTING W REFLEX)  GC/CHLAMYDIA PROBE AMP ([place]) NOT AT [facility]    EKG: None  Radiology: US  PELVIC COMPLETE W TRANSVAGINAL AND TORSION R/O Result Date: 08/27/2024 EXAM: US  Pelvis, Complete Transvaginal and Transabdominal with Doppler 08/27/2024 10:16:37 PM TECHNIQUE: Transabdominal and transvaginal pelvic duplex ultrasound using B-mode/gray scaled imaging with Doppler spectral analysis and color flow was obtained. COMPARISON: None available CLINICAL HISTORY: Right lower quadrant pain for 1 day. Last menstrual 08/19/2024. FINDINGS: UTERUS: Uterus measures 8.1 x 4 x 4.1 cm, volume 68.5 ml. Uterus demonstrates normal myometrial echotexture. ENDOMETRIAL STRIPE: Endometrial measures 9 mm. Endometrial stripe is within normal limits. RIGHT OVARY: Right ovary measures 2.6 x 1.6 x 2.1 cm, volume 4.5 ml. Right ovary is within normal limits. There is normal arterial and venous Doppler flow. LEFT OVARY: Left ovary measures 3.6 x 1.9 x 2.6 cm, volume 9.1 ml. Left ovary is within normal limits. There is normal arterial and venous Doppler flow. FREE FLUID: No free fluid. IMPRESSION: 1. No acute pelvic abnormality detected. Electronically signed by: Morgane Naveau MD 08/27/2024 10:20 PM EDT RP Workstation: [id number]    {Document cardiac monitor, telemetry assessment procedure when appropriate:32947} Procedures   Medications Ordered in the ED - No data to display    {Click here for ABCD2, HEART and other calculators REFRESH Note before signing:1}                              Medical Decision Making Amount and/or Complexity of Data Reviewed Labs: ordered. Radiology: ordered.   ***  {Document critical care time when appropriate  Document review of labs and clinical decision tools ie CHADS2VASC2, etc  Document your independent review of radiology images and any outside records  Document your discussion with family members, caretakers and with consultants  Document social  determinants of health affecting pt's care  Document your decision making why or why not admission, treatments were needed:32947:::1}   Final diagnoses:  None    ED Discharge Orders     None

## 2024-08-27 NOTE — ED Triage Notes (Signed)
 Pt POV reporting R side pelvic pain x1 day, hx ovarian cyst, concerned for rupture. Denies vaginal bleeding.

## 2024-08-28 ENCOUNTER — Emergency Department (HOSPITAL_BASED_OUTPATIENT_CLINIC_OR_DEPARTMENT_OTHER)

## 2024-08-28 LAB — RPR: RPR Ser Ql: NONREACTIVE

## 2024-08-28 LAB — HIV ANTIBODY (ROUTINE TESTING W REFLEX): HIV Screen 4th Generation wRfx: NONREACTIVE

## 2024-08-28 MED ORDER — IOHEXOL 300 MG/ML  SOLN
100.0000 mL | Freq: Once | INTRAMUSCULAR | Status: AC | PRN
  Administered 2024-08-28: 100 mL via INTRAVENOUS

## 2024-08-28 NOTE — ED Provider Notes (Signed)
 Patient signed out pending CT imaging.  CT imaging without evidence of acute appendicitis.  No obvious source of abdominal pain.  Patient advised to follow-up PCP.   Bari Charmaine FALCON, MD 08/28/24 [phone]

## 2024-08-28 NOTE — Discharge Instructions (Signed)
 It was a pleasure taking care of you today.  Your workup today was reassuring. I recommend Tylenol  and ibuprofen  as needed for pain management at home. Please follow-up outpatient with your PCP.

## 2024-08-29 LAB — GC/CHLAMYDIA PROBE AMP (~~LOC~~) NOT AT ARMC
Chlamydia: NEGATIVE
Comment: NEGATIVE
Comment: NORMAL
Neisseria Gonorrhea: NEGATIVE
